# Patient Record
Sex: Male | Born: 1995 | Race: Black or African American | Hispanic: No | Marital: Single | Smoking: Never smoker
Health system: Southern US, Community
[De-identification: ages and names within clinical notes are randomized; demographics above are authoritative.]

## PROBLEM LIST (undated history)

## (undated) DIAGNOSIS — I10 Essential (primary) hypertension: Secondary | ICD-10-CM

## (undated) DIAGNOSIS — F909 Attention-deficit hyperactivity disorder, unspecified type: Secondary | ICD-10-CM

## (undated) HISTORY — PX: HERNIA REPAIR: SHX51

## (undated) HISTORY — PX: ANKLE FRACTURE SURGERY: SHX122

---
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
## 2017-12-20 ENCOUNTER — Encounter (HOSPITAL_COMMUNITY): Payer: Self-pay | Admitting: Emergency Medicine

## 2017-12-20 ENCOUNTER — Emergency Department (HOSPITAL_COMMUNITY)
Admission: EM | Admit: 2017-12-20 | Discharge: 2017-12-20 | Disposition: A | Discharge status: Home / Self Care | Payer: Medicaid Other | Attending: Emergency Medicine | Admitting: Emergency Medicine

## 2017-12-20 ENCOUNTER — Emergency Department (HOSPITAL_COMMUNITY): Payer: Medicaid Other

## 2017-12-20 DIAGNOSIS — R519 Headache, unspecified: Secondary | ICD-10-CM

## 2017-12-20 DIAGNOSIS — R0789 Other chest pain: Secondary | ICD-10-CM | POA: Diagnosis not present

## 2017-12-20 DIAGNOSIS — Z87891 Personal history of nicotine dependence: Secondary | ICD-10-CM | POA: Diagnosis not present

## 2017-12-20 DIAGNOSIS — I10 Essential (primary) hypertension: Secondary | ICD-10-CM | POA: Diagnosis not present

## 2017-12-20 DIAGNOSIS — R0981 Nasal congestion: Secondary | ICD-10-CM | POA: Diagnosis not present

## 2017-12-20 DIAGNOSIS — R51 Headache: Secondary | ICD-10-CM | POA: Insufficient documentation

## 2017-12-20 DIAGNOSIS — R079 Chest pain, unspecified: Secondary | ICD-10-CM

## 2017-12-20 LAB — I-STAT CHEM 8, ED
BUN: 13 mg/dL (ref 6–20)
CALCIUM ION: 1.2 mmol/L (ref 1.15–1.40)
CHLORIDE: 104 mmol/L (ref 98–111)
CREATININE: 1 mg/dL (ref 0.61–1.24)
GLUCOSE: 96 mg/dL (ref 70–99)
HCT: 42 % (ref 39.0–52.0)
Hemoglobin: 14.3 g/dL (ref 13.0–17.0)
Potassium: 3.2 mmol/L — ABNORMAL LOW (ref 3.5–5.1)
Sodium: 141 mmol/L (ref 135–145)
TCO2: 27 mmol/L (ref 22–32)

## 2017-12-20 LAB — I-STAT TROPONIN, ED: TROPONIN I, POC: 0 ng/mL (ref 0.00–0.08)

## 2017-12-20 MED ORDER — PSEUDOEPHEDRINE-GUAIFENESIN ER 120-1200 MG PO TB12: 1.0000 | ORAL_TABLET | Freq: Two times a day (BID) | ORAL | 0 refills | Status: DC

## 2017-12-20 MED ORDER — KETOROLAC TROMETHAMINE 60 MG/2ML IM SOLN
60.0000 mg | Freq: Once | INTRAMUSCULAR | Status: AC
  Administered 2017-12-20: 60 mg via INTRAMUSCULAR
  Filled 2017-12-20: qty 2

## 2017-12-20 NOTE — ED Triage Notes (Signed)
Patient here from home with complaints of headache x2 days.

## 2017-12-20 NOTE — Discharge Instructions (Signed)
Take the prescribed medication as directed.  Can take tylenol or motrin for recurrent headache. Follow-up with your primary care doctor. Return to the ED for new or worsening symptoms.

## 2017-12-20 NOTE — ED Provider Notes (Signed)
[facility] DEPT Provider Note   CSN: [id number] Arrival date & time: 12/20/17  0405     History   Chief Complaint Chief Complaint  Patient presents with  . Headache    HPI Jeff Wall is a 22 y.o. male.  The history is provided by the patient and medical records.  Headache       22 y.o. F with hx of ADHD, asthma, HTN, presenting to the ED for headache and chest pain.  Reports this is been ongoing for about 2 days.  States headache is generalized throughout his entire head, pulsatile in nature.  He denies any associated nausea, vomiting, dizziness, confusion, numbness, or weakness.  No blurred vision or difficulty walking.  Denies any falls or head trauma.  Not currently on anticoagulation.  Denies any history of migraine headaches.  States he took motrin yesterday, but "he does not like taking medicine".  Patient also reports chest pain.  Described as central in nature, worse with deep breathing.  He denies any cough but has been having some nasal congestion.  Denies sick contacts.  No fever or chills.  No hemoptysis.  No known cardiac history.  No history of DVT or PE.  No recent travel or prolonged immobilization.  He is not a smoker.  Past Medical History:  Diagnosis Date  . ADHD (attention deficit hyperactivity disorder)   . Asthma   . Hypertension     There are no active problems to display for this patient.   Past Surgical History:  Procedure Laterality Date  . ANKLE FRACTURE SURGERY    . HERNIA REPAIR          Home Medications    Prior to Admission medications   Medication Sig Start Date End Date Taking? Authorizing Provider  acetaminophen (TYLENOL) 325 MG tablet Take 650 mg by mouth every 6 (six) hours as needed for pain.    [provider]  amLODipine (NORVASC) 5 MG tablet Take 5 mg by mouth daily.    [provider]  amoxicillin (AMOXIL) 500 MG capsule Take 1 capsule (500 mg total) by mouth 2 (two)  times daily. 05/10/14   Bjorn Pippin, PA-C  fluticasone (FLONASE) 50 MCG/ACT nasal spray Place 2 sprays into the nose daily. 03/22/11 03/21/12  Harden Mo, MD  Methylphenidate HCl (CONCERTA PO) Take by mouth.    [provider]  Phenylephrine-DM-GG [facility] COLD CHILDRENS PO) Take by mouth.    [provider]    Family History Family History  Problem Relation Age of Onset  . Asthma Sister     Social History Social History   Tobacco Use  . Smoking status: Never Smoker  . Smokeless tobacco: Never Used  Substance Use Topics  . Alcohol use: No  . Drug use: No     Allergies   Sulfa antibiotics   Review of Systems Review of Systems  Neurological: Positive for headaches.  All other systems reviewed and are negative.    Physical Exam Updated Vital Signs BP (!) 153/104 (BP Location: Left Arm)   Pulse 74   Temp (!) 97.4 F (36.3 C) (Oral)   Resp 18   SpO2 100%   Physical Exam  Constitutional: He is oriented to person, place, and time. He appears well-developed and well-nourished. No distress.  HENT:  Head: Normocephalic and atraumatic.  Right Ear: Tympanic membrane and external ear normal.  Left Ear: Tympanic membrane and external ear normal.  Nose: Mucosal edema present.  Mouth/Throat: Uvula is midline, oropharynx is clear and moist and mucous membranes are normal.  Sounds congested  Eyes: Pupils are equal, round, and reactive to light. Conjunctivae and EOM are normal.  Neck: Normal range of motion and full passive range of motion without pain. Neck supple. No neck rigidity.  No rigidity, no meningismus  Cardiovascular: Normal rate, regular rhythm and normal heart sounds.  No murmur heard. Pulmonary/Chest: Effort normal and breath sounds normal. No respiratory distress. He has no wheezes. He has no rhonchi.  Abdominal: Soft. Bowel sounds are normal. There is no tenderness. There is no guarding.  Musculoskeletal: Normal range of motion. He  exhibits no edema.  Neurological: He is alert and oriented to person, place, and time. He has normal strength. He displays no tremor. No cranial nerve deficit or sensory deficit. He displays no seizure activity.  AAOx3, answering questions and following commands appropriately; equal strength UE and LE bilaterally; CN grossly intact; moves all extremities appropriately without ataxia; no focal neuro deficits or facial asymmetry appreciated  Skin: Skin is warm and dry. No rash noted. He is not diaphoretic.  Psychiatric: He has a normal mood and affect. His behavior is normal. Thought content normal.  Nursing note and vitals reviewed.    ED Treatments / Results  Labs (all labs ordered are listed, but only abnormal results are displayed) Labs Reviewed  I-STAT CHEM 8, ED - Abnormal; Notable for the following components:      Result Value   Potassium 3.2 (*)    All other components within normal limits  I-STAT TROPONIN, ED    EKG None  Radiology Dg Chest 2 View  Result Date: 12/20/2017 CLINICAL DATA:  Acute onset mid chest pain and pressure.  Headache. EXAM: CHEST - 2 VIEW COMPARISON:  Chest radiograph March 22, 2011 FINDINGS: Cardiomediastinal silhouette is normal. No pleural effusions or focal consolidations. Trachea projects midline and there is no pneumothorax. Soft tissue planes and included osseous structures are non-suspicious. IMPRESSION: Normal chest. Electronically Signed   By: Elon Alas M.D.   On: 12/20/2017 04:42    Procedures Procedures (including critical care time)  Medications Ordered in ED Medications  ketorolac (TORADOL) injection 60 mg (60 mg Intramuscular Given 12/20/17 0453)     Initial Impression / Assessment and Plan / ED Course  I have reviewed the triage vital signs and the nursing notes.  Pertinent labs & imaging results that were available during my care of the patient were reviewed by me and considered in my medical decision making (see chart  for details).  22 year old male here with headache and chest pain.  States ongoing for 2 days.  He is afebrile and nontoxic in appearance.  He sounds congested and is sniffling during exam.  He denies any cough, nausea, or vomiting.  Does have some nasal congestion but remainder of exam is benign.  Lungs are clear without any wheezes or rhonchi.  Neurologically intact without focal deficits.  No nuchal rigidity or other signs of meningitis.  Chest x-ray clear.  EKG sinus rhythm without acute ischemic changes.  Troponin negative, chemistry panel reassuring.  Treated here with Toradol and reports resolution of headache.  Remains neurologically intact.  Appears stable for discharge.  Suspect he is likely coming down with a viral illness.  Recommended decongestants, Tylenol/Motrin for headache.  Can follow-up with PCP.  Return here for any new or worsening symptoms.  Final Clinical Impressions(s) / ED Diagnoses   Final diagnoses:  Nonintractable headache, unspecified chronicity pattern,  unspecified headache type  Chest pain in adult  Nasal congestion    ED Discharge Orders         Ordered    Pseudoephedrine-Guaifenesin [facility] D MAX STRENGTH) [phone] MG TB12  2 times daily     12/20/17 Huson, Patricie Geeslin M, PA-C 12/20/17 0541    Orpah Greek, MD 12/20/17 [phone]

## 2019-12-06 ENCOUNTER — Other Ambulatory Visit: Payer: Self-pay | Admitting: Otolaryngology

## 2019-12-13 ENCOUNTER — Encounter (HOSPITAL_BASED_OUTPATIENT_CLINIC_OR_DEPARTMENT_OTHER): Payer: Self-pay | Admitting: Otolaryngology

## 2019-12-13 ENCOUNTER — Other Ambulatory Visit: Payer: Self-pay

## 2019-12-16 ENCOUNTER — Other Ambulatory Visit (HOSPITAL_COMMUNITY): Payer: Medicaid Other | Attending: Otolaryngology

## 2019-12-17 ENCOUNTER — Other Ambulatory Visit (HOSPITAL_COMMUNITY)
Admission: RE | Admit: 2019-12-17 | Discharge: 2019-12-17 | Disposition: A | Discharge status: Home / Self Care | Payer: Medicaid Other | Source: Ambulatory Visit | Attending: Otolaryngology | Admitting: Otolaryngology

## 2019-12-17 DIAGNOSIS — Z01818 Encounter for other preprocedural examination: Secondary | ICD-10-CM | POA: Diagnosis present

## 2019-12-17 DIAGNOSIS — Z20822 Contact with and (suspected) exposure to covid-19: Secondary | ICD-10-CM | POA: Diagnosis not present

## 2019-12-17 LAB — SARS CORONAVIRUS 2 (TAT 6-24 HRS): SARS Coronavirus 2: NEGATIVE

## 2019-12-19 ENCOUNTER — Ambulatory Visit (HOSPITAL_BASED_OUTPATIENT_CLINIC_OR_DEPARTMENT_OTHER): Payer: Medicaid Other | Admitting: Anesthesiology

## 2019-12-19 ENCOUNTER — Ambulatory Visit (HOSPITAL_BASED_OUTPATIENT_CLINIC_OR_DEPARTMENT_OTHER)
Admission: RE | Admit: 2019-12-19 | Discharge: 2019-12-19 | Disposition: A | Discharge status: Home / Self Care | Payer: Medicaid Other | Attending: Otolaryngology | Admitting: Otolaryngology

## 2019-12-19 ENCOUNTER — Encounter (HOSPITAL_BASED_OUTPATIENT_CLINIC_OR_DEPARTMENT_OTHER): Payer: Self-pay | Admitting: Otolaryngology

## 2019-12-19 ENCOUNTER — Encounter (HOSPITAL_BASED_OUTPATIENT_CLINIC_OR_DEPARTMENT_OTHER)
Admission: RE | Disposition: A | Discharge status: Home / Self Care | Payer: Self-pay | Source: Home / Self Care | Attending: Otolaryngology

## 2019-12-19 ENCOUNTER — Other Ambulatory Visit: Payer: Self-pay

## 2019-12-19 DIAGNOSIS — Z79899 Other long term (current) drug therapy: Secondary | ICD-10-CM | POA: Insufficient documentation

## 2019-12-19 DIAGNOSIS — J352 Hypertrophy of adenoids: Secondary | ICD-10-CM | POA: Insufficient documentation

## 2019-12-19 DIAGNOSIS — J45909 Unspecified asthma, uncomplicated: Secondary | ICD-10-CM | POA: Insufficient documentation

## 2019-12-19 DIAGNOSIS — Z882 Allergy status to sulfonamides status: Secondary | ICD-10-CM | POA: Diagnosis not present

## 2019-12-19 DIAGNOSIS — G4730 Sleep apnea, unspecified: Secondary | ICD-10-CM | POA: Insufficient documentation

## 2019-12-19 DIAGNOSIS — F909 Attention-deficit hyperactivity disorder, unspecified type: Secondary | ICD-10-CM | POA: Insufficient documentation

## 2019-12-19 DIAGNOSIS — J0391 Acute recurrent tonsillitis, unspecified: Secondary | ICD-10-CM | POA: Insufficient documentation

## 2019-12-19 DIAGNOSIS — J353 Hypertrophy of tonsils with hypertrophy of adenoids: Secondary | ICD-10-CM | POA: Diagnosis present

## 2019-12-19 DIAGNOSIS — R0683 Snoring: Secondary | ICD-10-CM | POA: Insufficient documentation

## 2019-12-19 HISTORY — PX: TONSILLECTOMY AND ADENOIDECTOMY: SHX28

## 2019-12-19 SURGERY — TONSILLECTOMY AND ADENOIDECTOMY
Anesthesia: General | Site: Throat | Laterality: Bilateral

## 2019-12-19 MED ORDER — LACTATED RINGERS IV SOLN: INTRAVENOUS | Status: DC

## 2019-12-19 MED ORDER — SUGAMMADEX SODIUM 500 MG/5ML IV SOLN
INTRAVENOUS | Status: AC
  Filled 2019-12-19: qty 5

## 2019-12-19 MED ORDER — HYDROCODONE-ACETAMINOPHEN 7.5-325 MG/15ML PO SOLN
15.0000 mL | ORAL | 0 refills | Status: AC | PRN
Start: 2019-12-19 — End: 2019-12-26

## 2019-12-19 MED ORDER — LIDOCAINE HCL (CARDIAC) PF 100 MG/5ML IV SOSY
PREFILLED_SYRINGE | INTRAVENOUS | Status: DC | PRN
  Administered 2019-12-19: 60 mg via INTRAVENOUS

## 2019-12-19 MED ORDER — FENTANYL CITRATE (PF) 100 MCG/2ML IJ SOLN
INTRAMUSCULAR | Status: DC | PRN
  Administered 2019-12-19: 100 ug via INTRAVENOUS
  Administered 2019-12-19: 50 ug via INTRAVENOUS

## 2019-12-19 MED ORDER — BACITRACIN ZINC 500 UNIT/GM EX OINT
1.0000 "application " | TOPICAL_OINTMENT | Freq: Three times a day (TID) | CUTANEOUS | Status: DC
  Filled 2019-12-19: qty 28.35

## 2019-12-19 MED ORDER — FENTANYL CITRATE (PF) 100 MCG/2ML IJ SOLN
INTRAMUSCULAR | Status: AC
  Filled 2019-12-19: qty 2

## 2019-12-19 MED ORDER — PREDNISOLONE SODIUM PHOSPHATE 15 MG/5ML PO SOLN: 15.0000 mg | Freq: Every day | ORAL | 0 refills | Status: AC

## 2019-12-19 MED ORDER — PROPOFOL 10 MG/ML IV BOLUS
INTRAVENOUS | Status: DC | PRN
  Administered 2019-12-19: 300 mg via INTRAVENOUS

## 2019-12-19 MED ORDER — ROCURONIUM BROMIDE 10 MG/ML (PF) SYRINGE
PREFILLED_SYRINGE | INTRAVENOUS | Status: AC
  Filled 2019-12-19: qty 10

## 2019-12-19 MED ORDER — DOCUSATE SODIUM 100 MG PO CAPS: 100.0000 mg | ORAL_CAPSULE | Freq: Two times a day (BID) | ORAL | 0 refills | Status: AC | PRN

## 2019-12-19 MED ORDER — ONDANSETRON HCL 4 MG/2ML IJ SOLN
INTRAMUSCULAR | Status: DC | PRN
  Administered 2019-12-19: 4 mg via INTRAVENOUS

## 2019-12-19 MED ORDER — MIDAZOLAM HCL 2 MG/2ML IJ SOLN
INTRAMUSCULAR | Status: AC
  Filled 2019-12-19: qty 2

## 2019-12-19 MED ORDER — MIDAZOLAM HCL 5 MG/5ML IJ SOLN
INTRAMUSCULAR | Status: DC | PRN
  Administered 2019-12-19: 2 mg via INTRAVENOUS

## 2019-12-19 MED ORDER — FENTANYL CITRATE (PF) 100 MCG/2ML IJ SOLN
25.0000 ug | INTRAMUSCULAR | Status: DC | PRN
  Administered 2019-12-19 (×2): 50 ug via INTRAVENOUS

## 2019-12-19 MED ORDER — ACETAMINOPHEN 500 MG PO TABS
ORAL_TABLET | ORAL | Status: AC
  Filled 2019-12-19: qty 2

## 2019-12-19 MED ORDER — SUGAMMADEX SODIUM 500 MG/5ML IV SOLN
INTRAVENOUS | Status: DC | PRN
  Administered 2019-12-19: 400 mg via INTRAVENOUS

## 2019-12-19 MED ORDER — ONDANSETRON HCL 4 MG/2ML IJ SOLN: 4.0000 mg | INTRAMUSCULAR | Status: DC | PRN

## 2019-12-19 MED ORDER — ACETAMINOPHEN 500 MG PO TABS
1000.0000 mg | ORAL_TABLET | Freq: Once | ORAL | Status: AC
  Administered 2019-12-19: 1000 mg via ORAL

## 2019-12-19 MED ORDER — ONDANSETRON HCL 4 MG PO TABS: 4.0000 mg | ORAL_TABLET | ORAL | Status: DC | PRN

## 2019-12-19 MED ORDER — OXYMETAZOLINE HCL 0.05 % NA SOLN
NASAL | Status: DC | PRN
  Administered 2019-12-19: 1 via TOPICAL

## 2019-12-19 MED ORDER — PROPOFOL 10 MG/ML IV BOLUS
INTRAVENOUS | Status: AC
  Filled 2019-12-19: qty 20

## 2019-12-19 MED ORDER — ROCURONIUM BROMIDE 100 MG/10ML IV SOLN
INTRAVENOUS | Status: DC | PRN
  Administered 2019-12-19: 50 mg via INTRAVENOUS

## 2019-12-19 MED ORDER — LIDOCAINE 2% (20 MG/ML) 5 ML SYRINGE
INTRAMUSCULAR | Status: AC
  Filled 2019-12-19: qty 5

## 2019-12-19 MED ORDER — AMISULPRIDE (ANTIEMETIC) 5 MG/2ML IV SOLN: 10.0000 mg | Freq: Once | INTRAVENOUS | Status: DC | PRN

## 2019-12-19 MED ORDER — DEXAMETHASONE SODIUM PHOSPHATE 4 MG/ML IJ SOLN
INTRAMUSCULAR | Status: DC | PRN
  Administered 2019-12-19: 10 mg via INTRAVENOUS

## 2019-12-19 MED ORDER — DEXAMETHASONE SODIUM PHOSPHATE 10 MG/ML IJ SOLN
INTRAMUSCULAR | Status: AC
  Filled 2019-12-19: qty 1

## 2019-12-19 MED ORDER — DEXMEDETOMIDINE (PRECEDEX) IN NS 20 MCG/5ML (4 MCG/ML) IV SYRINGE
PREFILLED_SYRINGE | INTRAVENOUS | Status: DC | PRN
  Administered 2019-12-19 (×2): 8 ug via INTRAVENOUS

## 2019-12-19 MED ORDER — IBUPROFEN 100 MG/5ML PO SUSP: 600.0000 mg | Freq: Four times a day (QID) | ORAL | Status: DC | PRN

## 2019-12-19 MED ORDER — HYDROCODONE-ACETAMINOPHEN 7.5-325 MG/15ML PO SOLN: 10.0000 mL | ORAL | Status: DC | PRN

## 2019-12-19 MED ORDER — ONDANSETRON HCL 4 MG/2ML IJ SOLN
INTRAMUSCULAR | Status: AC
  Filled 2019-12-19: qty 2

## 2019-12-19 SURGICAL SUPPLY — 36 items
CANISTER SUCT 1200ML W/VALVE (MISCELLANEOUS) ×3 IMPLANT
CATH ROBINSON RED A/P 12FR (CATHETERS) ×3 IMPLANT
CLEANER CAUTERY TIP 5X5 PAD (MISCELLANEOUS) IMPLANT
COAGULATOR SUCT 6 FR SWTCH (ELECTROSURGICAL) ×1
COAGULATOR SUCT SWTCH 10FR 6 (ELECTROSURGICAL) ×2 IMPLANT
COVER BACK TABLE 60X90IN (DRAPES) ×3 IMPLANT
COVER MAYO STAND STRL (DRAPES) ×3 IMPLANT
COVER WAND RF STERILE (DRAPES) IMPLANT
ELECT COATED BLADE 2.86 ST (ELECTRODE) ×3 IMPLANT
ELECT REM PT RETURN 9FT ADLT (ELECTROSURGICAL) ×3
ELECT REM PT RETURN 9FT PED (ELECTROSURGICAL)
ELECTRODE REM PT RETRN 9FT PED (ELECTROSURGICAL) IMPLANT
ELECTRODE REM PT RTRN 9FT ADLT (ELECTROSURGICAL) ×1 IMPLANT
GAUZE SPONGE 4X4 12PLY STRL LF (GAUZE/BANDAGES/DRESSINGS) ×3 IMPLANT
GLOVE BIO SURGEON STRL SZ 6.5 (GLOVE) ×2 IMPLANT
GLOVE BIO SURGEONS STRL SZ 6.5 (GLOVE) ×1
GLOVE BIOGEL PI IND STRL 6.5 (GLOVE) ×2 IMPLANT
GLOVE BIOGEL PI INDICATOR 6.5 (GLOVE) ×4
GLOVE ECLIPSE 6.5 STRL STRAW (GLOVE) ×6 IMPLANT
GOWN STRL REUS W/ TWL LRG LVL3 (GOWN DISPOSABLE) ×4 IMPLANT
GOWN STRL REUS W/TWL LRG LVL3 (GOWN DISPOSABLE) ×12
MARKER SKIN DUAL TIP RULER LAB (MISCELLANEOUS) IMPLANT
NS IRRIG 1000ML POUR BTL (IV SOLUTION) ×3 IMPLANT
PAD CLEANER CAUTERY TIP 5X5 (MISCELLANEOUS)
PENCIL SMOKE EVACUATOR (MISCELLANEOUS) ×3 IMPLANT
SHEET MEDIUM DRAPE 40X70 STRL (DRAPES) ×3 IMPLANT
SOLUTION BUTLER CLEAR DIP (MISCELLANEOUS) ×3 IMPLANT
SPONGE TONSIL TAPE 1 RFD (DISPOSABLE) ×3 IMPLANT
SPONGE TONSIL TAPE 1.25 RFD (DISPOSABLE) IMPLANT
SYR BULB EAR ULCER 3OZ GRN STR (SYRINGE) ×3 IMPLANT
TOWEL GREEN STERILE FF (TOWEL DISPOSABLE) ×3 IMPLANT
TUBE CONNECTING 20'X1/4 (TUBING) ×1
TUBE CONNECTING 20X1/4 (TUBING) ×2 IMPLANT
TUBE SALEM SUMP 12R W/ARV (TUBING) IMPLANT
TUBE SALEM SUMP 16 FR W/ARV (TUBING) ×3 IMPLANT
YANKAUER SUCT BULB TIP NO VENT (SUCTIONS) ×3 IMPLANT

## 2019-12-19 NOTE — Anesthesia Procedure Notes (Signed)
Procedure Name: Intubation Performed by: Boomer Winders M, CRNA Pre-anesthesia Checklist: Patient identified, Emergency Drugs available, Suction available and Patient being monitored Patient Re-evaluated:Patient Re-evaluated prior to induction Oxygen Delivery Method: Circle system utilized Preoxygenation: Pre-oxygenation with 100% oxygen Induction Type: IV induction Ventilation: Mask ventilation without difficulty Laryngoscope Size: Mac and 3 Grade View: Grade I Tube type: Oral Tube size: 7.0 mm Number of attempts: 1 Airway Equipment and Method: Stylet and Oral airway Placement Confirmation: ETT inserted through vocal cords under direct vision,  positive ETCO2,  breath sounds checked- equal and bilateral and CO2 detector Secured at: 22 cm Tube secured with: Tape Dental Injury: Teeth and Oropharynx as per pre-operative assessment        

## 2019-12-19 NOTE — Anesthesia Postprocedure Evaluation (Signed)
Anesthesia Post Note  Patient: Jeff Wall  Procedure(s) Performed: TONSILLECTOMY AND ADENOIDECTOMY (Bilateral Throat)     Patient location during evaluation: PACU Anesthesia Type: General Level of consciousness: awake and alert Pain management: pain level controlled Vital Signs Assessment: post-procedure vital signs reviewed and stable Respiratory status: spontaneous breathing, nonlabored ventilation, respiratory function stable and patient connected to nasal cannula oxygen Cardiovascular status: blood pressure returned to baseline and stable Postop Assessment: no apparent nausea or vomiting Anesthetic complications: no   No complications documented.  Last Vitals:  Vitals:   12/19/19 1000 12/19/19 1015  BP: 139/85 131/86  Pulse: 68 70  Resp: 16 16  Temp:    SpO2: 100% 100%    Last Pain:  Vitals:   12/19/19 1015  TempSrc:   PainSc: 2                  Tiajuana Amass

## 2019-12-19 NOTE — Transfer of Care (Signed)
Immediate Anesthesia Transfer of Care Note  Patient: Jeff Wall  Procedure(s) Performed: TONSILLECTOMY AND ADENOIDECTOMY (Bilateral Throat)  Patient Location: PACU  Anesthesia Type:General  Level of Consciousness: awake, alert  and oriented  Airway & Oxygen Therapy: Patient Spontanous Breathing and Patient connected to face mask oxygen  Post-op Assessment: Report given to RN and Post -op Vital signs reviewed and stable  Post vital signs: Reviewed and stable  Last Vitals:  Vitals Value Taken Time  BP    Temp    Pulse 80 12/19/19 0932  Resp 18 12/19/19 0932  SpO2 100 % 12/19/19 0932  Vitals shown include unvalidated device data.  Last Pain:  Vitals:   12/19/19 0734  TempSrc: Oral  PainSc: 0-No pain         Complications: No complications documented.

## 2019-12-19 NOTE — Anesthesia Preprocedure Evaluation (Signed)
Anesthesia Evaluation  Patient identified by MRN, date of birth, ID band Patient awake    Reviewed: Allergy & Precautions, NPO status , Patient's Chart, lab work & pertinent test results  Airway Mallampati: II  TM Distance: >3 FB Neck ROM: Full    Dental  (+) Dental Advisory Given   Pulmonary asthma ,    breath sounds clear to auscultation       Cardiovascular negative cardio ROS   Rhythm:Regular Rate:Normal     Neuro/Psych negative neurological ROS     GI/Hepatic negative GI ROS, Neg liver ROS,   Endo/Other  negative endocrine ROS  Renal/GU negative Renal ROS     Musculoskeletal   Abdominal   Peds  Hematology negative hematology ROS (+)   Anesthesia Other Findings   Reproductive/Obstetrics                             Anesthesia Physical Anesthesia Plan  ASA: II  Anesthesia Plan: General   Post-op Pain Management:    Induction: Intravenous  PONV Risk Score and Plan: 2 and Dexamethasone, Ondansetron and Treatment may vary due to age or medical condition  Airway Management Planned: Oral ETT  Additional Equipment:   Intra-op Plan:   Post-operative Plan: Extubation in OR  Informed Consent: I have reviewed the patients History and Physical, chart, labs and discussed the procedure including the risks, benefits and alternatives for the proposed anesthesia with the patient or authorized representative who has indicated his/her understanding and acceptance.     Dental advisory given  Plan Discussed with: CRNA  Anesthesia Plan Comments:         Anesthesia Quick Evaluation

## 2019-12-19 NOTE — H&P (Signed)
Jeff Wall is an 24 y.o. male.    Chief Complaint:  Adenotonsillar hypertrophy, recurrent tonsillitis  HPI: Patient presents today for planned elective procedure. He was previously seen in the office on 12/04/2019 with complaints of recurrent tonsillitis, snoring cough, possible sleep apnea. He denies any changes to his history since evaluation at that time.  Past Medical History:  Diagnosis Date  . ADHD (attention deficit hyperactivity disorder)   . Asthma     Past Surgical History:  Procedure Laterality Date  . ANKLE FRACTURE SURGERY    . HERNIA REPAIR      Family History  Problem Relation Age of Onset  . Asthma Sister     Social History:  reports that he has never smoked. He has never used smokeless tobacco. He reports that he does not drink alcohol and does not use drugs.  Allergies:  Allergies  Allergen Reactions  . Sulfa Antibiotics Swelling    No medications prior to admission.    Results for orders placed or performed during the hospital encounter of 12/17/19 (from the past 48 hour(s))  SARS CORONAVIRUS 2 (TAT 6-24 HRS) Nasopharyngeal Nasopharyngeal Swab     Status: None   Collection Time: 12/17/19  9:04 AM   Specimen: Nasopharyngeal Swab  Result Value Ref Range   SARS Coronavirus 2 NEGATIVE NEGATIVE    Comment: (NOTE) SARS-CoV-2 target nucleic acids are NOT DETECTED.  The SARS-CoV-2 RNA is generally detectable in upper and lower respiratory specimens during the acute phase of infection. Negative results do not preclude SARS-CoV-2 infection, do not rule out co-infections with other pathogens, and should not be used as the sole basis for treatment or other patient management decisions. Negative results must be combined with clinical observations, patient history, and epidemiological information. The expected result is Negative.  Fact Sheet for Patients: SugarRoll.be  Fact Sheet for Healthcare  Providers: https://www.woods-mathews.com/  This test is not yet approved or cleared by the [place] FDA and  has been authorized for detection and/or diagnosis of SARS-CoV-2 by FDA under an Emergency Use Authorization (EUA). This EUA will remain  in effect (meaning this test can be used) for the duration of the COVID-19 declaration under Se ction 564(b)(1) of the Act, 21 U.S.C. section 360bbb-3(b)(1), unless the authorization is terminated or revoked sooner.  Performed at [facility], [place] [address] [place], [place] [address]    No results found.  ROS: ROS  Blood pressure 137/82, pulse 65, temperature 97.8 F (36.6 C), temperature source Oral, resp. rate 18, height 5\' 11"  (1.803 m), weight 126.1 kg, SpO2 100 %.  PHYSICAL EXAM: General: Well developed, well nourished. No acute distress. Voice intact, no voice breaks  Head/Face: Normocephalic, atraumatic.  Eyes: Pupils are equal, round and reactive to light. Conjunctiva and lids are normal. Normal extraocular mobility.  Ears: Pinna and external meatus normal bilaterally Nose: No gross deformity or lesions. No purulent discharge. Mouth/Oropharynx: Lips normal, teeth and gums normal with good dentition, normal oral vestibule. Normal floor of mouth, tongue and oral mucosa, no mucosal lesions, ulcer or mass, normal tongue mobility. Uvula midline. Hard and soft palate normal with normal mobility. 4+ tonsils, cryptic, no erythema or exudate.  Neck: Trachea midline. No masses. No crepitus.  Lymphatic: No lymphadenopathy in the neck.  Respiratory: No stridor or distress.  Extremities: No edema or cyanosis. Warm and well-perfused.  Neurologic: Alert and oriented to self, place and time. Normal reflexes and motor skills, balance and coordination. Moving all extremities without gross  abnormality.  Psychiatric: No unusual anxiety or evidence of depression. Appropriate affect.   Studies Reviewed:  None   Assessment/Plan  Jeff Wall is a 24 y.o. male with adenotonsillar hypertrophy, recurrent tonsillitis, snoring, symptoms of sleep disordered breathing. Patient consented for tonsillectomy and adenoidectomy. Benefits and possible complications of the procedure were again reviewed with the patient as well as the postoperative course and anticipated recovery. Consent signed and placed in chart, all questions answered.   Burgundy Matuszak A Kinsey Cowsert 12/19/2019, 8:21 AM

## 2019-12-19 NOTE — Op Note (Signed)
OPERATIVE NOTE  YUNIOR JAIN Date/Time of Admission: 12/19/2019  7:13 AM  CSN: [id number];LKT:[id number] Attending Provider: Ebbie Latus A, DO Room/Bed: [facility]SP/NONE DOB: March 06, 1995 Age: 24 y.o.   Pre-Op Diagnosis: TONSILLAR HYPERTROPHY, RECURRENT TONSILLITIS  Post-Op Diagnosis: TONSILLAR HYPERTROPHY, RECURRENT TONSILLITIS  Procedure: Procedure(s): TONSILLECTOMY AND ADENOIDECTOMY  Anesthesia: General  Surgeon(s): Jason Coop, DO  Staff: Circulator: Ted Mcalpine, RN Relief Scrub: Carson Myrtle, RN Scrub Person: Courtney Heys S  Implants: * No implants in log *  Specimens: ID Type Source Tests Collected by Time Destination  1 : Right tonsil Tissue PATH Tonsil/Adenoid SURGICAL PATHOLOGY Shalese Strahan, [place] A, DO 12/19/2019 0854   2 : Left tonsil Tissue PATH Tonsil/Adenoid SURGICAL PATHOLOGY Jasmine Mcbeth, [place] A, DO 34/28/7681 1572     Complications: None  EBL: 5 ML  Condition: stable  Operative Findings:  4+ tonsils, adenoids with 40% obstruction  Description of Operation: Once operative consent was obtained, and the surgical site confirmed with the operating room team, the patient was brought back to the operating room and general endotracheal anesthesia was obtained. The patient was turned over to the ENT service. A Crow-Davis mouth gag was used to expose the oral cavity and oropharynx. A red rubber catheter was placed from the right nasal cavity to the oral cavity to retract the soft palate. Attention was first turned to the right tonsil, which was excised at the level of the capsule using electrocautery. Hemostasis was obtained. The exact procedure was repeated on the left side. Attention was turned to the adenoid bed using a mirror from the oral cavity and the adenoids were removed using electrocautery. The patient was relieved from oral suspension and then placed back in oral suspension to assure hemostasis, which was obtained. An oral gastric tube  was placed into the stomach and suctioned to reduce postoperative nausea. The patient was turned back over to the anesthesia service. The patient was then transferred to the PACU in stable condition.  Jason Coop, [facility]  12/19/2019

## 2019-12-19 NOTE — Discharge Instructions (Signed)
Post Anesthesia Home Care Instructions  Activity: Get plenty of rest for the remainder of the day. A responsible individual must stay with you for 24 hours following the procedure.  For the next 24 hours, DO NOT: -Drive a car -Paediatric nurse -Drink alcoholic beverages -Take any medication unless instructed by your physician -Make any legal decisions or sign important papers.  Meals: Start with liquid foods such as gelatin or soup. Progress to regular foods as tolerated. Avoid greasy, spicy, heavy foods. If nausea and/or vomiting occur, drink only clear liquids until the nausea and/or vomiting subsides. Call your physician if vomiting continues.  Special Instructions/Symptoms: Your throat may feel dry or sore from the anesthesia or the breathing tube placed in your throat during surgery. If this causes discomfort, gargle with warm salt water. The discomfort should disappear within 24 hours.  If you had a scopolamine patch placed behind your ear for the management of post- operative nausea and/or vomiting:  1. The medication in the patch is effective for 72 hours, after which it should be removed.  Wrap patch in a tissue and discard in the trash. Wash hands thoroughly with soap and water. 2. You may remove the patch earlier than 72 hours if you experience unpleasant side effects which may include dry mouth, dizziness or visual disturbances. 3. Avoid touching the patch. Wash your hands with soap and water after contact with the patch.       Tonsillectomy Post Operative Instructions   Effects of Anesthesia Tonsillectomy (with or without Adenoidectomy) involves a brief anesthesia,  typically 20 - 60 minutes. Patients may be quite irritable for several hours after  surgery. If sedatives were given, some patients will remain sleepy for much of the  day. Nausea and vomiting is occasionally seen, and usually resolves by the  evening of surgery - even without additional  medications. Medications Tonsillectomy is a painful procedure. Pain medications help but do not  completely alleviate the discomfort.   ADULTS . Adults will be prescribed a narcotic pain pill or elixir (Percocet, Norco,  Vicodin, Lortab are some examples). Do not use aspirin products (Bayer's,  Goode powders, Excedrin) - they may increase the chance of bleeding.  Every time you take a dose of pain medication, do so with some food or full  liquid to prevent nausea. The best thing to take with the medication is a  cup of pudding or ice cream, a milkshake or cup of milk. You may take additional Tylenol (650mg ) every 6 hours if you are having breakthrough pain. If pain is still not controlled, take 800mg  of Ibuprofen every 8 hours, but make sure to take with food.   Activity  Vigorous exercise should be avoided for 14 days after surgery. This risk of  bleeding is increased with increased activity and bleeding from where the tonsils  were removed can happen for up to 2 weeks after surgery. Baths and showers are fine. Many patients have reduced energy levels until their pain decreases and  they are taking in more nourishment and calories. You should not travel out of  the local area for a full 2 weeks after surgery in case you experience bleeding  after surgery.   Eating & Drinking Dehydration is the biggest enemy in the recovery period. It will increase the pain,  increase the risk of bleeding and delay the healing. It usually happens because  the pain of swallowing keeps the patient from drinking enough liquids. Therefore,  the key is to force  fluids, and that works best when pain control is maximized. You cannot drink too much after having a tonsillectomy. The only drinks to avoid  are citrus like orange and grapefruit juices because they will burn the back of the  throat. Incentive charts with prizes work very well to get young children to drink  fluids and take their medications after  surgery. Some patients will have a small  amount of liquid come out of their nose when they drink after surgery, this should  stop within a few weeks after surgery.  Although drinking is more important, eating is fine even the day of surgery but  avoid foods that are crunchy or have sharp edges. Dairy products may be taken,  if desired. You should avoid acidic, salty and spicy foods (especially tomato  sauces). Chewing gum or bubble gum encourages swallowing and saliva flow,  and may even speed up the healing. Almost everyone loses some weight after  tonsillectomy (which is usually regained in the 2nd or 3rd week after surgery).  Drinking is far more important that eating in the first 14 days after surgery, so  concentrate on that first and foremost. Adequate liquid intake probably speeds  Recovery.  Other things. . Pain is usually the worst in the morning; this can be avoided by overnight  medication administration if needed. . Since moisture helps soothe the healing throat, a room humidifier (hot or  cold) is suggested when the patient is sleeping. . Some patients feel pain relief with an ice collar to the neck (or a bag of  frozen peas or corn). Be careful to avoid placing cold plastic directly on the  skin - wrap in a paper towel or washcloth.  . If the tonsils and adenoids are very large, the patient's voice may change  after surgery. . The recovery from tonsillectomy is a very painful period, often the worst  pain people can recall, so please be understanding and patient with  yourself, or the patient you are caring for. It is helpful to take pain  medicine during the night if the patient awakens-- the worst pain is usually  in the morning. The pain may seem to increase 2-5 days after surgery - this is normal when inflammation sets in. Please be aware that no  combination of medicines will eliminate the pain - the patient will need to  continue eating/drinking in spite of the  remaining discomfort. . You should not travel outside of the local area for 14 days after surgery in  case significant bleeding occurs.   What should we expect after surgery? As previously mentioned, most patients have a significant amount of pain after  tonsillectomy, with pain resolving 7-14 days after surgery. Older children and  adults seem to have more discomfort. Most patients can go home the day of  surgery. . Ear pain: Many people will complain of earaches after tonsillectomy. This  is caused by referred pain coming from throat and not the ears. Give pain  medications and encourage liquid intake. . Fever: Many patients have a low-grade fever after tonsillectomy - up to  101.5 degrees (380 C.) for several days. Higher prolonged fever should be  reported to your surgeon. . Bad looking (and bad smelling) throat: After surgery, the place where  the tonsils were removed is covered with a white film, which is a moist  scab. This usually develops 3-5 days after surgery and falls off 10-14 days  after surgery and usually causes bad breath.  There will be some redness  and swelling as well. The uvula (the part of the throat that hangs down in  the middle between the tonsils) is usually swollen for several days after  surgery. . Sore/bruised feeling of Tongue: This is common for the first few days  after surgery because the tongue is pushed out of the way to take out the  tonsils in surgery.  When should we call the doctor? . Nausea/Vomiting: This is a common side effect from General Anesthesia  and can last up to 24-36 hours after surgery. Try giving sips of clear liquids  like Sprite, water or apple juice then gradually increase fluid intake. If the  nausea or vomiting continues beyond this time frame, call the doctor's  office for medications that will help relieve the nausea and vomiting. . Bleeding: Significant bleeding is rare, but it happens to about 5% of  patients who have  tonsillectomy. It may come from the nose, the mouth, or  be vomited or coughed up. Ice water mouthwashes may help stop or  reduce bleeding. If you have bleeding that does not stop, you should call  the office (during business hours) or the on call physician (evenings, weekends) or go to the emergency room if you are very concerned.  . Dehydration: If there has been little or no liquids intake for 24 hours, the  patient may need to come to the hospital for IV fluids. Signs of dehydration  include lethargy, the lack of tears when crying, and reduced or very  concentrated urine output. . High Fever: If the patient has a consistent temperatures greater than 102,  or when accompanied by cough or difficulty breathing, you should call the  doctor's office.

## 2019-12-20 ENCOUNTER — Encounter (HOSPITAL_BASED_OUTPATIENT_CLINIC_OR_DEPARTMENT_OTHER): Payer: Self-pay | Admitting: Otolaryngology

## 2019-12-20 LAB — SURGICAL PATHOLOGY

## 2019-12-21 NOTE — Discharge Summary (Signed)
Physician Discharge Summary  Patient ID: Jeff Wall MRN: [id number] DOB/AGE: [birth year] 24 y.o.  Admit date: 12/19/2019 Discharge date: 12/21/2019  Admission Diagnoses:  Active Problems:   Adenotonsillar hypertrophy   Discharge Diagnoses:  Same  Surgeries: Procedure(s): TONSILLECTOMY AND ADENOIDECTOMY on 12/19/2019   Consultants: None  Discharged Condition: Improved  Hospital Course: Jeff Wall is an 24 y.o. male who was admitted 12/19/2019 with a chief complaint of snoring, sleep disordered breathing, recurrent tonsillitis, and found to have a diagnosis of adenotonsillar hypertrophy.  They were brought to the operating room on 12/19/2019 and underwent the above named procedures.  Patient was monitored in the recovery area for 4 hours post procedure and was noted to do well.  Pain was adequately controlled, he was tolerating a liquid diet without difficulty, and his vitals were stable.  He was deemed stable for discharge home.   Recent vital signs:  Vitals:   12/19/19 1015 12/19/19 1400  BP: 131/86   Pulse: 70   Resp: 16   Temp:  98.7 F (37.1 C)  SpO2: 100%     Recent laboratory studies:  Results for orders placed or performed during the hospital encounter of 12/19/19  Surgical pathology  Result Value Ref Range   SURGICAL PATHOLOGY      SURGICAL PATHOLOGY CASE: [id number] PATIENT: [place] Surgical Pathology Report     Clinical History: tonsillar hypertrophy, recurrent tonsillitis (cm)   FINAL MICROSCOPIC DIAGNOSIS:  A. TONSIL, RIGHT, TONSILLECTOMY: -  Benign tonsil with lymphoid hyperplasia  B. TONSIL, LEFT, TONSILLECTOMY: -  Benign tonsil with lymphoid hyperplasia   GROSS DESCRIPTION:  Specimen A: Right tonsil, received in formalin Size: 3.5 x 2.6 x 1.9 cm Mucosa: Pink to hyperemic, varies from smooth to granular. Cut Surface(s): Tan-pink to dark red, normal crypt architecture.  No discrete lesions or masses. Block Summary: A  representative section is submitted in one block.  Specimen B: Left tonsil, received in formalin Size: 3.9 x 2.1 x 2 cm Mucosa: Pink to hyperemic, varies from smooth to granular. Cut Surface(s): Tan-pink to dark red, normal crypt architecture.  No discrete lesions or masses. Block Summary: A representative section is submitted in one block.  SW 11/11/ 2021    Final Diagnosis performed by Thressa Sheller, MD.   Electronically signed 12/20/2019 Technical and / or Professional components performed at [facility]. [facility], [place] [address], [place], [place] [address].  Immunohistochemistry Technical component (if applicable) was performed at [facility]. [address], [place], [place], [place] [address].   IMMUNOHISTOCHEMISTRY DISCLAIMER (if applicable): Some of these immunohistochemical stains may have been developed and the performance characteristics determine by [facility]. Some may not have been cleared or approved by the U.S. Food and Drug Administration. The FDA has determined that such clearance or approval is not necessary. This test is used for clinical purposes. It should not be regarded as investigational or for research. This laboratory is certified under the [employer] (CLIA-88) as qualified to perform high complexity clinical l aboratory testing.  The controls stained appropriately.     Discharge Medications:   Allergies as of 12/19/2019      Reactions   Sulfa Antibiotics Swelling      Medication List    TAKE these medications   docusate sodium 100 MG capsule Commonly known as: Colace Take 1 capsule (100 mg total) by mouth 2 (two) times daily as needed for up to 10 days.   HYDROcodone-acetaminophen  7.5-325 mg/15 ml solution Commonly known as: HYCET Take 15 mLs by mouth every 4 (four) hours as needed for up to 7 days for moderate pain or severe pain.   prednisoLONE 15 MG/5ML  solution Commonly known as: ORAPRED Take 5 mLs (15 mg total) by mouth daily for 5 days.       Diagnostic Studies: No results found.  Disposition: Discharge disposition: 01-Home or Self Care       Discharge Instructions    Diet general   Complete by: As directed    No wound care   Complete by: As directed        Follow-up Information    Matha Masse A, DO Follow up.   Specialty: Otolaryngology Why: Follow up as needed. Nurse will call to check on you in 2-3 weeks  Contact information: [place]. [address] [place] [address] [phone]                Signed: [place] Lillieanna Tuohy 12/21/2019, 4:29 PM

## 2019-12-23 ENCOUNTER — Other Ambulatory Visit: Payer: Self-pay

## 2019-12-23 ENCOUNTER — Encounter (HOSPITAL_COMMUNITY): Payer: Self-pay | Admitting: Emergency Medicine

## 2019-12-23 ENCOUNTER — Emergency Department (HOSPITAL_COMMUNITY)
Admission: EM | Admit: 2019-12-23 | Discharge: 2019-12-23 | Disposition: A | Discharge status: Home / Self Care | Payer: Medicaid Other | Attending: Emergency Medicine | Admitting: Emergency Medicine

## 2019-12-23 DIAGNOSIS — J45909 Unspecified asthma, uncomplicated: Secondary | ICD-10-CM | POA: Diagnosis not present

## 2019-12-23 DIAGNOSIS — J029 Acute pharyngitis, unspecified: Secondary | ICD-10-CM | POA: Diagnosis not present

## 2019-12-23 DIAGNOSIS — Z9089 Acquired absence of other organs: Secondary | ICD-10-CM

## 2019-12-23 DIAGNOSIS — G8918 Other acute postprocedural pain: Secondary | ICD-10-CM | POA: Insufficient documentation

## 2019-12-23 MED ORDER — AMOXICILLIN 500 MG PO CAPS: 1000.0000 mg | ORAL_CAPSULE | Freq: Once | ORAL | Status: DC

## 2019-12-23 MED ORDER — AMOXICILLIN 400 MG/5ML PO SUSR: 1000.0000 mg | Freq: Two times a day (BID) | ORAL | 0 refills | Status: AC

## 2019-12-23 MED ORDER — AMOXICILLIN 500 MG PO CAPS: 1000.0000 mg | ORAL_CAPSULE | Freq: Two times a day (BID) | ORAL | 0 refills | Status: DC

## 2019-12-23 NOTE — ED Triage Notes (Signed)
Pt had his tonsils out 4 days ago and his throat is sore, but he states that the pain now is different than before. He is concerned about infection.

## 2019-12-23 NOTE — ED Provider Notes (Signed)
[facility] DEPT Provider Note: Georgena Spurling, MD, FACEP  CSN: [id number] MRN: [id number] ARRIVAL: 12/23/19 at [place]: [place]  Sore Throat   HISTORY OF PRESENT ILLNESS  12/23/19 5:28 AM Jeff Wall is a 24 y.o. male who underwent a tonsillectomy and adenoidectomy for adenotonsillar hypertrophy on 12/19/2019.  He is here this morning with pain which is different than the pain he was initially having postoperatively.  He is concerned about infection.  He was discharged home on hydrocodone/APAP liquid, docusate and prednisone for 5 days.  He has not had a fever and is not having anterior cervical lymphadenopathy.  His pain is moderate, worse with swallowing.   Past Medical History:  Diagnosis Date  . ADHD (attention deficit hyperactivity disorder)   . Asthma     Past Surgical History:  Procedure Laterality Date  . ANKLE FRACTURE SURGERY    . HERNIA REPAIR    . TONSILLECTOMY AND ADENOIDECTOMY Bilateral 12/19/2019   Procedure: TONSILLECTOMY AND ADENOIDECTOMY;  Surgeon: Jason Coop, DO;  Location: [facility];  Service: ENT;  Laterality: Bilateral;    Family History  Problem Relation Age of Onset  . Asthma Sister     Social History   Tobacco Use  . Smoking status: Never Smoker  . Smokeless tobacco: Never Used  Vaping Use  . Vaping Use: Some days  Substance Use Topics  . Alcohol use: No  . Drug use: No    Prior to Admission medications   Medication Sig Start Date End Date Taking? Authorizing Provider  amoxicillin (AMOXIL) 500 MG capsule Take 2 capsules (1,000 mg total) by mouth 2 (two) times daily. 12/23/19   Lyah Millirons, MD  docusate sodium (COLACE) 100 MG capsule Take 1 capsule (100 mg total) by mouth 2 (two) times daily as needed for up to 10 days. 12/19/19 12/29/19  Skotnicki, Meghan A, DO  HYDROcodone-acetaminophen (HYCET) 7.5-325 mg/15 ml solution Take 15 mLs by mouth every 4 (four) hours as needed for up to 7  days for moderate pain or severe pain. 12/19/19 12/26/19  Skotnicki, Meghan A, DO  prednisoLONE (ORAPRED) 15 MG/5ML solution Take 5 mLs (15 mg total) by mouth daily for 5 days. 12/20/19 12/25/19  Skotnicki, Meghan A, DO    Allergies Sulfa antibiotics   REVIEW OF SYSTEMS  Negative except as noted here or in the History of Present Illness.   PHYSICAL EXAMINATION  Initial Vital Signs Blood pressure (!) 154/100, pulse 71, temperature 98.2 F (36.8 C), temperature source Oral, resp. rate 15, height 5\' 11"  (1.803 m), weight 126.1 kg, SpO2 98 %.  Examination General: Well-developed, well-nourished male in no acute distress; appearance consistent with age of record HENT: normocephalic; pharynx status post tonsillectomy with typical postoperative appearance:    Eyes: pupils equal, round and reactive to light; extraocular muscles intact Neck: supple; no lymphadenopathy Heart: regular rate and rhythm Lungs: clear to auscultation bilaterally Abdomen: soft; nondistended; nontender; bowel sounds present Extremities: No deformity; full range of motion Neurologic: Awake, alert and oriented; motor function intact in all extremities and symmetric; no facial droop Skin: Warm and dry Psychiatric: Normal mood and affect   RESULTS  Summary of this visit's results, reviewed and interpreted by myself:   EKG Interpretation  Date/Time:    Ventricular Rate:    PR Interval:    QRS Duration:   QT Interval:    QTC Calculation:   R Axis:     Text Interpretation:  Laboratory Studies: No results found for this or any previous visit (from the past 24 hour(s)). Imaging Studies: No results found.  ED COURSE and MDM  Nursing notes, initial and subsequent vitals signs, including pulse oximetry, reviewed and interpreted by myself.  Vitals:   12/23/19 0523  BP: (!) 154/100  Pulse: 71  Resp: 15  Temp: 98.2 F (36.8 C)  TempSrc: Oral  SpO2: 98%  Weight: 126.1 kg  Height: 5\' 11"  (1.803  m)   Medications  amoxicillin (AMOXIL) capsule 1,000 mg (has no administration in time range)    The patient does not appear to have a postoperative infection but would like to be placed on an antibiotic as a precaution.  We will place him on amoxicillin and refer him back to his surgeon.   PROCEDURES  Procedures   ED DIAGNOSES     ICD-10-CM   1. Post-tonsillectomy pain  G89.18    Z90.89        Jaquavis Felmlee, MD 12/23/19 [phone]

## 2020-04-16 IMAGING — CR DG CHEST 2V
2 series · 2 of 2 positions shown · non-contrast
Comparison: Chest radiograph March 22, 2011

CLINICAL DATA: Acute onset mid chest pain and pressure.  Headache.

EXAM:
CHEST - 2 VIEW

[w chest pa]
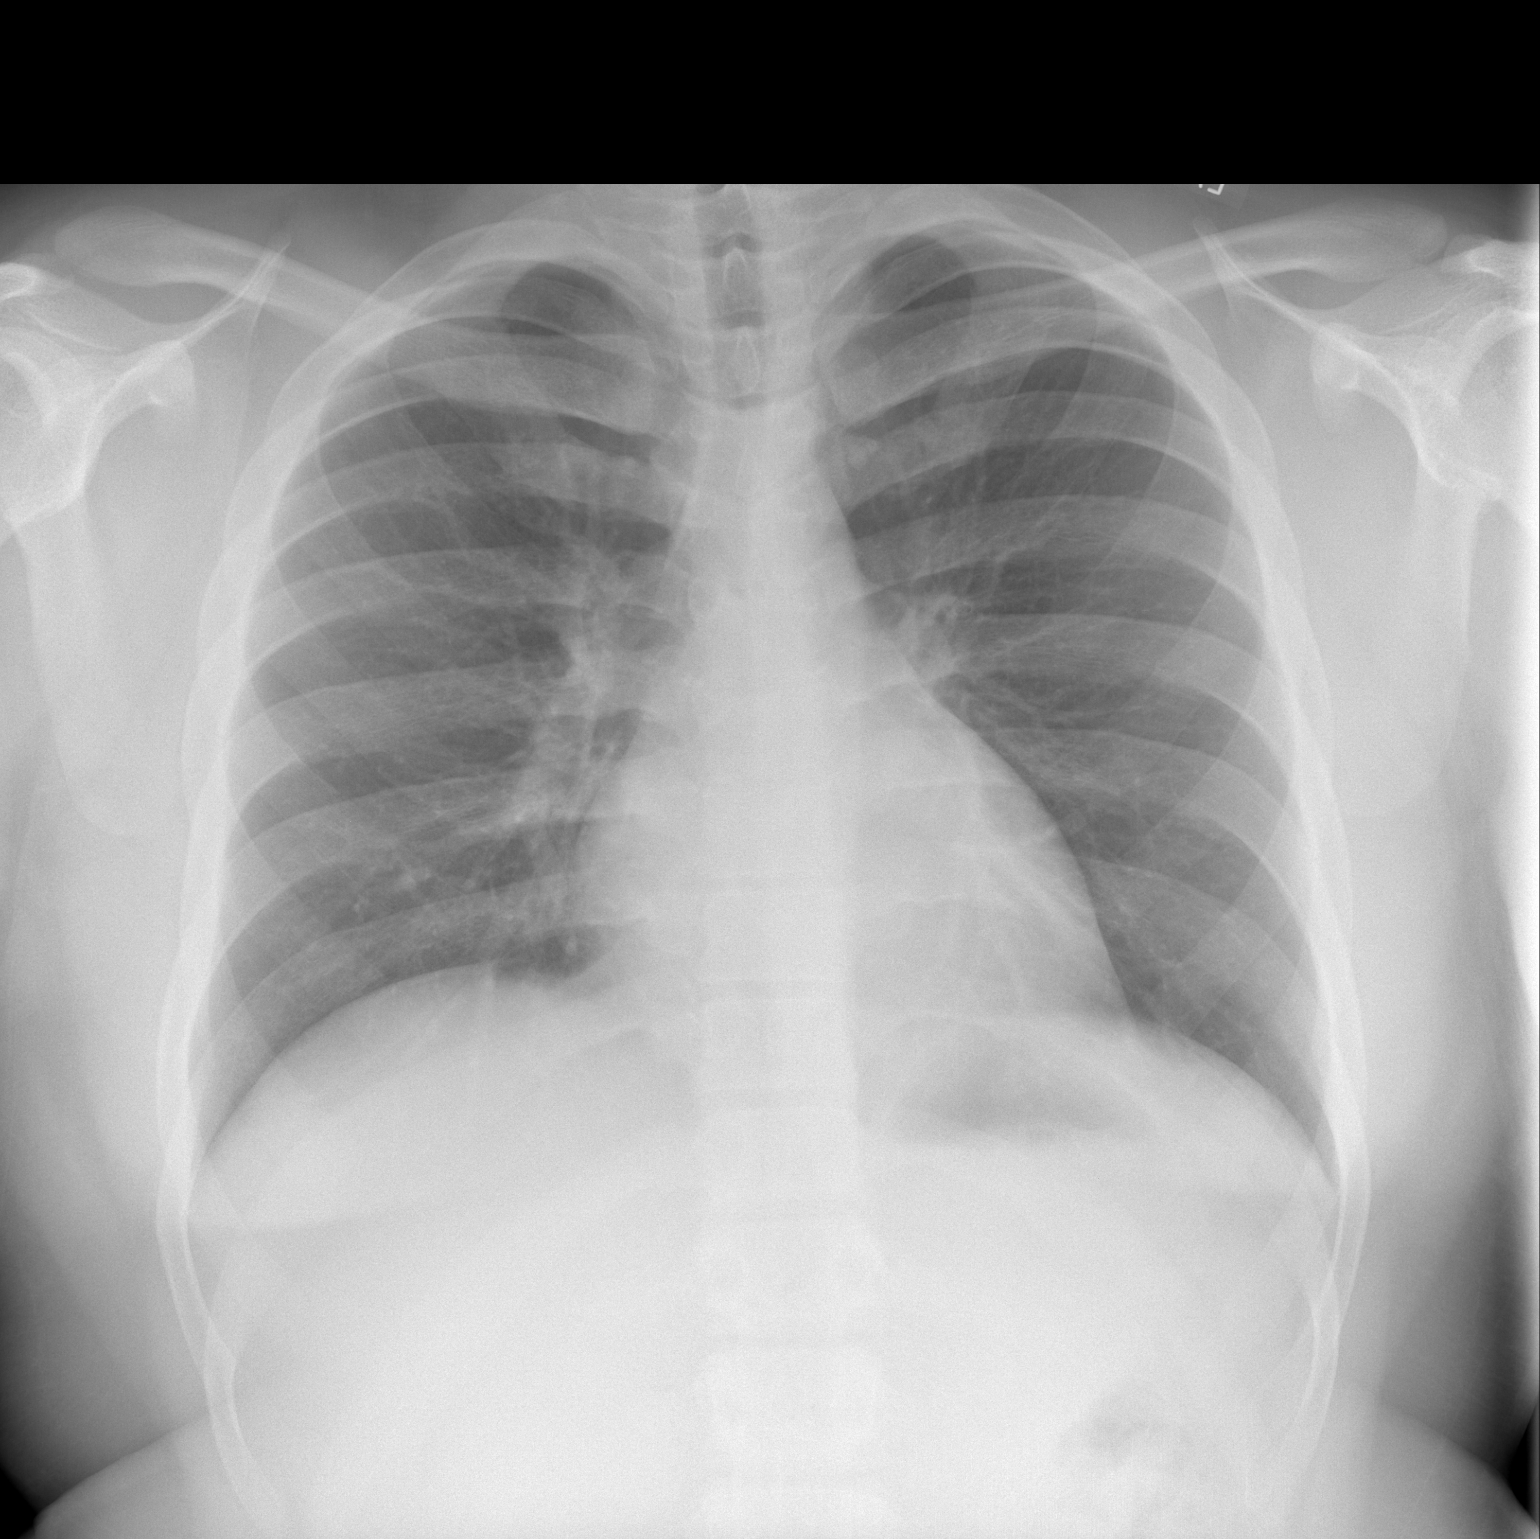

[w chest lat]
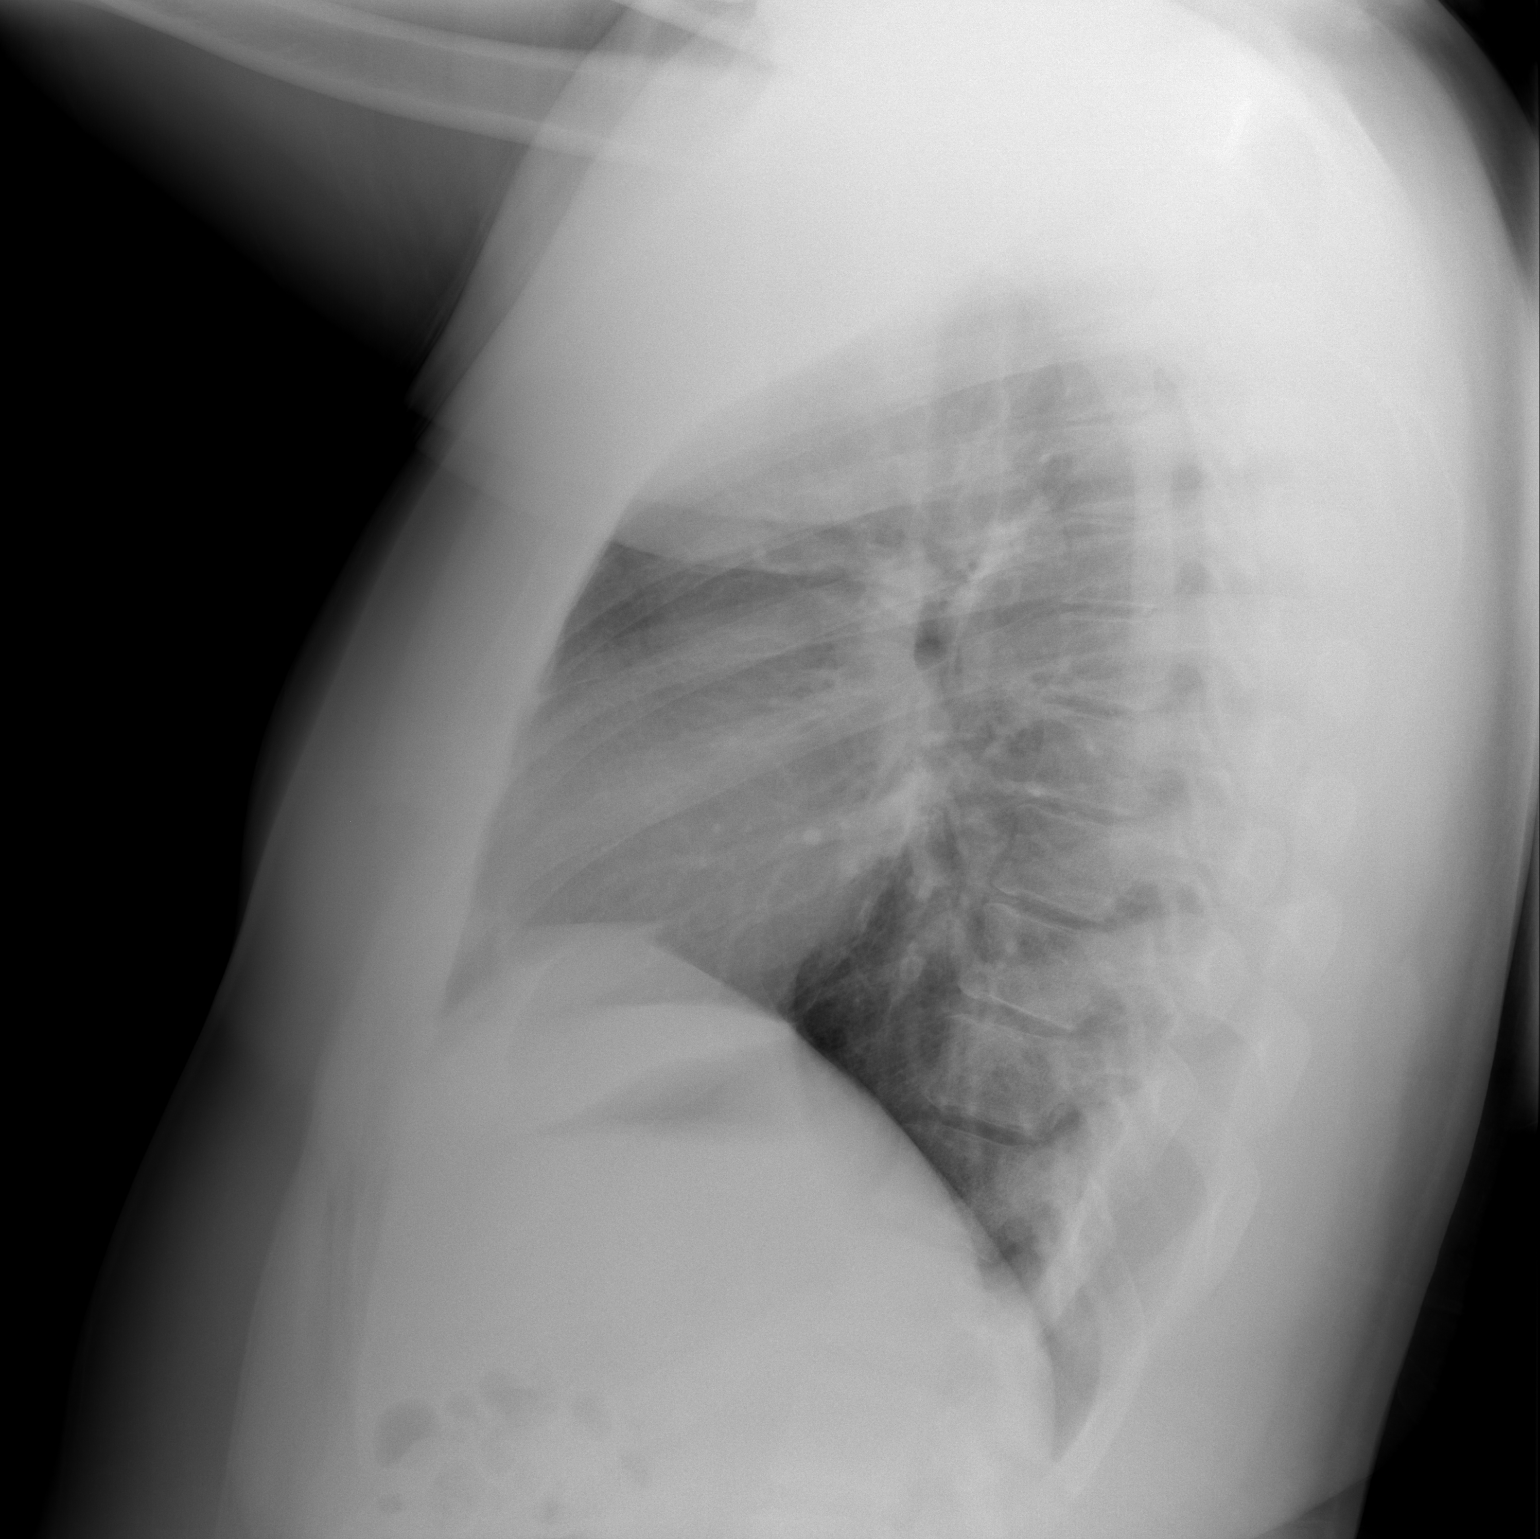

[2 of 2 positions shown; findings below may reference images not displayed]

FINDINGS: Cardiomediastinal silhouette is normal. No pleural effusions or
focal consolidations. Trachea projects midline and there is no
pneumothorax. Soft tissue planes and included osseous structures are
non-suspicious.
IMPRESSION: Normal chest.

## 2020-06-21 ENCOUNTER — Other Ambulatory Visit: Payer: Self-pay

## 2020-06-21 ENCOUNTER — Emergency Department (HOSPITAL_COMMUNITY)
Admission: EM | Admit: 2020-06-21 | Discharge: 2020-06-21 | Disposition: A | Discharge status: Home / Self Care | Payer: Medicaid Other | Attending: Emergency Medicine | Admitting: Emergency Medicine

## 2020-06-21 DIAGNOSIS — S61412A Laceration without foreign body of left hand, initial encounter: Secondary | ICD-10-CM

## 2020-06-21 DIAGNOSIS — W25XXXA Contact with sharp glass, initial encounter: Secondary | ICD-10-CM | POA: Diagnosis not present

## 2020-06-21 DIAGNOSIS — Z23 Encounter for immunization: Secondary | ICD-10-CM | POA: Diagnosis not present

## 2020-06-21 DIAGNOSIS — S61422A Laceration with foreign body of left hand, initial encounter: Secondary | ICD-10-CM | POA: Diagnosis not present

## 2020-06-21 DIAGNOSIS — S6992XA Unspecified injury of left wrist, hand and finger(s), initial encounter: Secondary | ICD-10-CM | POA: Diagnosis present

## 2020-06-21 DIAGNOSIS — J45909 Unspecified asthma, uncomplicated: Secondary | ICD-10-CM | POA: Diagnosis not present

## 2020-06-21 MED ORDER — LIDOCAINE-EPINEPHRINE (PF) 2 %-1:200000 IJ SOLN
10.0000 mL | Freq: Once | INTRAMUSCULAR | Status: AC
  Administered 2020-06-21: 10 mL
  Filled 2020-06-21: qty 20

## 2020-06-21 MED ORDER — TETANUS-DIPHTH-ACELL PERTUSSIS 5-2.5-18.5 LF-MCG/0.5 IM SUSY
0.5000 mL | PREFILLED_SYRINGE | Freq: Once | INTRAMUSCULAR | Status: AC
  Administered 2020-06-21: 0.5 mL via INTRAMUSCULAR
  Filled 2020-06-21: qty 0.5

## 2020-06-21 NOTE — ED Triage Notes (Signed)
Pt was moving a glass table and it broke and cut his L hand. Bleeding controlled. Unsure of last tetanus.

## 2020-06-21 NOTE — Discharge Instructions (Addendum)

## 2020-06-21 NOTE — ED Provider Notes (Signed)
[facility] DEPT Provider Note   CSN: [id number] Arrival date & time: 06/21/20  0453     History Chief Complaint  Patient presents with  . Laceration    Jeff Wall is a 25 y.o. male presents to the Emergency Department complaining of acute, persistent laceration to the dorsum of the left hand onset several hours prior to arrival.  Patient reports he was moving a glass coffee table when it broke cutting his hand.  He denies numbness, tingling or weakness.  Hemostasis achieved prior to arrival.  Unknown last tetanus.  No aggravating or alleviating factors.  The history is provided by the patient and medical records. No language interpreter was used.       Past Medical History:  Diagnosis Date  . ADHD (attention deficit hyperactivity disorder)   . Asthma     Patient Active Problem List   Diagnosis Date Noted  . Adenotonsillar hypertrophy 12/19/2019    Past Surgical History:  Procedure Laterality Date  . ANKLE FRACTURE SURGERY    . HERNIA REPAIR    . TONSILLECTOMY AND ADENOIDECTOMY Bilateral 12/19/2019   Procedure: TONSILLECTOMY AND ADENOIDECTOMY;  Surgeon: Jason Coop, DO;  Location: [facility];  Service: ENT;  Laterality: Bilateral;       Family History  Problem Relation Age of Onset  . Asthma Sister     Social History   Tobacco Use  . Smoking status: Never Smoker  . Smokeless tobacco: Never Used  Vaping Use  . Vaping Use: Some days  Substance Use Topics  . Alcohol use: No  . Drug use: No    Home Medications Prior to Admission medications   Not on File    Allergies    Sulfa antibiotics  Review of Systems   Review of Systems  Constitutional: Negative for fever.  Gastrointestinal: Negative for nausea and vomiting.  Skin: Positive for wound.  Allergic/Immunologic: Negative for immunocompromised state.  Neurological: Negative for weakness and numbness.  Hematological: Does not bruise/bleed  easily.  Psychiatric/Behavioral: The patient is not nervous/anxious.     Physical Exam Updated Vital Signs BP (!) 184/82 (BP Location: Left Arm)   Pulse 74   Temp 98.2 F (36.8 C) (Oral)   Resp 16   SpO2 100%   Physical Exam Vitals and nursing note reviewed.  Constitutional:      General: He is not in acute distress.    Appearance: He is well-developed.  HENT:     Head: Normocephalic.  Eyes:     General: No scleral icterus.    Conjunctiva/sclera: Conjunctivae normal.  Cardiovascular:     Rate and Rhythm: Normal rate.  Pulmonary:     Effort: Pulmonary effort is normal.  Musculoskeletal:        General: Normal range of motion.     Cervical back: Normal range of motion.     Comments: Full range of motion of all fingers of the left hand.  Specifically full range of motion of the DIP, PIP and MCP of the left pointer finger  Skin:    General: Skin is warm and dry.     Comments: 3 cm laceration proximal to the MCP of the left pointer finger.  Neurological:     Mental Status: He is alert.     Comments: Station intact throughout the entire left hand.  Strength 5/5 in the left hand including grip strength and isolated flexion and extension of the DIP, PIP and MCP of the left pointer  finger.     ED Results / Procedures / Treatments   Labs (all labs ordered are listed, but only abnormal results are displayed) Labs Reviewed - No data to display  EKG None  Radiology No results found.  Procedures .Marland KitchenLaceration Repair  Date/Time: 06/21/2020 7:01 AM Performed by: Abigail Butts, PA-C Authorized by: Abigail Butts, PA-C   Consent:    Consent obtained:  Verbal   Consent given by:  Patient   Risks discussed:  Infection, need for additional repair, pain, poor cosmetic result and poor wound healing   Alternatives discussed:  No treatment and delayed treatment Universal protocol:    Procedure explained and questions answered to patient or proxy's satisfaction: yes      Relevant documents present and verified: yes     Test results available: yes     Imaging studies available: yes     Required blood products, implants, devices, and special equipment available: yes     Site/side marked: yes     Immediately prior to procedure, a time out was called: yes     Patient identity confirmed:  Verbally with patient Anesthesia:    Anesthesia method:  Local infiltration   Local anesthetic:  Lidocaine 2% WITH epi Laceration details:    Location:  Hand   Hand location:  L hand, dorsum   Length (cm):  3 Pre-procedure details:    Preparation:  Patient was prepped and draped in usual sterile fashion Exploration:    Hemostasis achieved with:  Epinephrine and direct pressure   Wound exploration: wound explored through full range of motion and entire depth of wound visualized     Wound extent: no tendon damage noted   Treatment:    Area cleansed with:  Saline   Amount of cleaning:  Standard   Irrigation solution:  Sterile water   Irrigation volume:  559mL   Irrigation method:  Syringe Skin repair:    Repair method:  Sutures   Suture size:  4-0   Suture material:  Prolene   Suture technique:  Simple interrupted   Number of sutures:  3 Approximation:    Approximation:  Close Repair type:    Repair type:  Intermediate Post-procedure details:    Dressing:  Splint for protection   Procedure completion:  Tolerated well, no immediate complications     Medications Ordered in ED Medications  lidocaine-EPINEPHrine (XYLOCAINE W/EPI) 2 %-1:200000 (PF) injection 10 mL (10 mLs Infiltration Given by Other 06/21/20 0600)  Tdap (BOOSTRIX) injection 0.5 mL (0.5 mLs Intramuscular Given 06/21/20 0600)    ED Course  I have reviewed the triage vital signs and the nursing notes.  Pertinent labs & imaging results that were available during my care of the patient were reviewed by me and considered in my medical decision making (see chart for details).    MDM  Rules/Calculators/A&P                          Pressure irrigation performed. Wound explored and base of wound visualized in a bloodless field without evidence of foreign body.  Base of wound explored through range of motion.  No evidence of tendon laceration.  Laceration occurred < 8 hours prior to repair which was well tolerated. Tdap updated.  Pt has no comorbidities to effect normal wound healing. Pt discharged without antibiotics.  Discussed suture home care with patient and answered questions. Pt to follow-up for wound check and suture removal in 7 days; they are  to return to the ED sooner for signs of infection. Pt is hemodynamically stable with no complaints prior to dc.    Final Clinical Impression(s) / ED Diagnoses Final diagnoses:  Laceration of left hand, foreign body presence unspecified, initial encounter    Rx / DC Orders ED Discharge Orders    None       Iysis Germain, Gwenlyn Perking 06/21/20 9528    Fatima Blank, MD 06/22/20 2122

## 2020-06-21 NOTE — ED Notes (Signed)
Patients wound irrigated.

## 2020-06-21 NOTE — ED Notes (Signed)
Thumb spica placed on patient before discharge.

## 2020-06-22 NOTE — Progress Notes (Signed)
Orthopedic Tech Progress Note Patient Details:  Jeff Wall Feb 26, 1995 [id number]          [place] Pink 06/22/2020, 11:09 AMCharge for splint supplies.

## 2020-06-29 ENCOUNTER — Encounter (HOSPITAL_COMMUNITY): Payer: Self-pay | Admitting: Emergency Medicine

## 2020-06-29 ENCOUNTER — Emergency Department (HOSPITAL_COMMUNITY)
Admission: EM | Admit: 2020-06-29 | Discharge: 2020-06-29 | Disposition: A | Discharge status: Home / Self Care | Payer: Medicaid Other | Attending: Emergency Medicine | Admitting: Emergency Medicine

## 2020-06-29 ENCOUNTER — Other Ambulatory Visit: Payer: Self-pay

## 2020-06-29 DIAGNOSIS — S61412D Laceration without foreign body of left hand, subsequent encounter: Secondary | ICD-10-CM | POA: Diagnosis not present

## 2020-06-29 DIAGNOSIS — J45909 Unspecified asthma, uncomplicated: Secondary | ICD-10-CM | POA: Diagnosis not present

## 2020-06-29 DIAGNOSIS — X58XXXD Exposure to other specified factors, subsequent encounter: Secondary | ICD-10-CM | POA: Insufficient documentation

## 2020-06-29 DIAGNOSIS — S6992XD Unspecified injury of left wrist, hand and finger(s), subsequent encounter: Secondary | ICD-10-CM | POA: Diagnosis present

## 2020-06-29 DIAGNOSIS — Z4802 Encounter for removal of sutures: Secondary | ICD-10-CM

## 2020-06-29 NOTE — ED Triage Notes (Signed)
Pt arrived via POV. Pt had a laceration on his left hand and just needs his stiches removed. No other complaints at this time.

## 2020-06-29 NOTE — ED Provider Notes (Signed)
[facility] DEPT Provider Note   CSN: [id number] Arrival date & time: 06/29/20  0932     History Chief Complaint  Patient presents with  . Suture / Staple Removal    Jeff Wall is a 25 y.o. male presents to the ED for suture removal.  He had a laceration in his left hand repaired 8 days ago.  Denies any significant issues with the wound, sutures.  No redness warmth bleeding drainage or separation.  He received his tetanus shot during laceration repair.  HPI     Past Medical History:  Diagnosis Date  . ADHD (attention deficit hyperactivity disorder)   . Asthma     Patient Active Problem List   Diagnosis Date Noted  . Adenotonsillar hypertrophy 12/19/2019    Past Surgical History:  Procedure Laterality Date  . ANKLE FRACTURE SURGERY    . HERNIA REPAIR    . TONSILLECTOMY AND ADENOIDECTOMY Bilateral 12/19/2019   Procedure: TONSILLECTOMY AND ADENOIDECTOMY;  Surgeon: Jason Coop, DO;  Location: [facility];  Service: ENT;  Laterality: Bilateral;       Family History  Problem Relation Age of Onset  . Asthma Sister     Social History   Tobacco Use  . Smoking status: Never Smoker  . Smokeless tobacco: Never Used  Vaping Use  . Vaping Use: Some days  Substance Use Topics  . Alcohol use: No  . Drug use: No    Home Medications Prior to Admission medications   Not on File    Allergies    Sulfa antibiotics  Review of Systems   Review of Systems  Skin: Positive for wound.  All other systems reviewed and are negative.   Physical Exam Updated Vital Signs BP (!) 153/99 (BP Location: Left Arm)   Pulse 73   Temp 98.3 F (36.8 C) (Oral)   Resp 16   Ht 5\' 11"  (1.803 m)   Wt 126.1 kg   SpO2 100%   BMI 38.77 kg/m   Physical Exam Constitutional:      Appearance: He is well-developed.  HENT:     Head: Normocephalic.     Nose: Nose normal.  Eyes:     General: Lids are normal.  Cardiovascular:      Rate and Rhythm: Normal rate.  Pulmonary:     Effort: Pulmonary effort is normal. No respiratory distress.  Musculoskeletal:        General: Normal range of motion.     Cervical back: Normal range of motion.  Skin:    Comments: approx 2 cm linear laceration in left 2nd MCP, wounds are well approximated. 3 sutures in place. No erythema warmth drainage bleeding or dehiscence   Neurological:     Mental Status: He is alert.  Psychiatric:        Behavior: Behavior normal.     ED Results / Procedures / Treatments   Labs (all labs ordered are listed, but only abnormal results are displayed) Labs Reviewed - No data to display  EKG None  Radiology No results found.  Procedures .Suture Removal  Date/Time: 06/29/2020 9:31 AM Performed by: Kinnie Feil, PA-C Authorized by: Kinnie Feil, PA-C   Consent:    Consent obtained:  Verbal   Consent given by:  Patient   Risks, benefits, and alternatives were discussed: yes     Risks discussed:  Bleeding, wound separation and pain Universal protocol:    Procedure explained and questions answered to patient or  proxy's satisfaction: yes     Immediately prior to procedure, a time out was called: yes     Patient identity confirmed:  Verbally with patient Location:    Location:  Upper extremity   Upper extremity location:  Hand   Hand location:  L index finger Procedure details:    Wound appearance:  No signs of infection, good wound healing and clean   Number of sutures removed:  3 Post-procedure details:    Post-removal:  No dressing applied   Procedure completion:  Tolerated well, no immediate complications     Medications Ordered in ED Medications - No data to display  ED Course  I have reviewed the triage vital signs and the nursing notes.  Pertinent labs & imaging results that were available during my care of the patient were reviewed by me and considered in my medical decision making (see chart for details).     MDM Rules/Calculators/A&P                          25 year old here for suture removal.  Sustained a laceration on 5/15.  At that time he had tetanus updated.  Denies any red flags like fever, drainage, bleeding, warmth, significant or worsening pain.  Exam is reassuring, laceration appears to be healing well.  No signs of infection, dehiscence.  Sutures removed here without any complication.  Wound care instructions given.  Return cautions discussed.  Patient is comfortable with this plan.  Final Clinical Impression(s) / ED Diagnoses Final diagnoses:  Visit for suture removal    Rx / DC Orders ED Discharge Orders    None       Kinnie Feil, PA-C 06/29/20 0932    Varney Biles, MD 06/30/20 1214

## 2020-06-29 NOTE — Discharge Instructions (Signed)
Return for signs of infection including redness, warmth, fevers, chills or opening of wound  Apply over the counter antibiotic as needed

## 2020-07-17 ENCOUNTER — Other Ambulatory Visit: Payer: Self-pay | Admitting: Urology

## 2020-07-17 DIAGNOSIS — D2930 Benign neoplasm of unspecified epididymis: Secondary | ICD-10-CM

## 2020-08-13 ENCOUNTER — Ambulatory Visit (HOSPITAL_COMMUNITY)
Admission: RE | Admit: 2020-08-13 | Discharge: 2020-08-13 | Disposition: A | Discharge status: Home / Self Care | Payer: Medicaid Other | Source: Ambulatory Visit | Attending: Urology | Admitting: Urology

## 2020-08-13 ENCOUNTER — Other Ambulatory Visit: Payer: Self-pay

## 2020-08-13 DIAGNOSIS — D2930 Benign neoplasm of unspecified epididymis: Secondary | ICD-10-CM | POA: Diagnosis not present

## 2021-03-14 ENCOUNTER — Other Ambulatory Visit: Payer: Self-pay

## 2021-03-14 ENCOUNTER — Encounter: Payer: Self-pay | Admitting: Emergency Medicine

## 2021-03-14 ENCOUNTER — Ambulatory Visit
Admission: EM | Admit: 2021-03-14 | Discharge: 2021-03-14 | Disposition: A | Discharge status: Home / Self Care | Payer: 59 | Attending: Physician Assistant | Admitting: Physician Assistant

## 2021-03-14 DIAGNOSIS — J0140 Acute pansinusitis, unspecified: Secondary | ICD-10-CM

## 2021-03-14 MED ORDER — AMOXICILLIN-POT CLAVULANATE 875-125 MG PO TABS: 1.0000 | ORAL_TABLET | Freq: Two times a day (BID) | ORAL | 0 refills | Status: AC

## 2021-03-14 NOTE — ED Triage Notes (Signed)
Pt here for nasal congestion and some cough x 2 weeks; denies fever

## 2021-03-14 NOTE — ED Provider Notes (Signed)
[facility]    CSN: [id number] Arrival date & time: 03/14/21  2094      History   Chief Complaint Chief Complaint  Patient presents with   Nasal Congestion         HPI Jeff Wall is a 26 y.o. male.   Patient here today for evaluation of nasal congestion and sinus pressure he has had the last 2 weeks. He initially thought symptoms were related to allergies and tried claritin without significant relief. He then tried other OTC sinus medication without resolution. He has not had fever. He reports some cough from throat irritation at times.   The history is provided by the patient.   Past Medical History:  Diagnosis Date   ADHD (attention deficit hyperactivity disorder)    Asthma     Patient Active Problem List   Diagnosis Date Noted   Adenotonsillar hypertrophy 12/19/2019    Past Surgical History:  Procedure Laterality Date   ANKLE FRACTURE SURGERY     HERNIA REPAIR     TONSILLECTOMY AND ADENOIDECTOMY Bilateral 12/19/2019   Procedure: TONSILLECTOMY AND ADENOIDECTOMY;  Surgeon: Jason Coop, DO;  Location: [facility];  Service: ENT;  Laterality: Bilateral;       Home Medications    Prior to Admission medications   Medication Sig Start Date End Date Taking? Authorizing Provider  amoxicillin-clavulanate (AUGMENTIN) 875-125 MG tablet Take 1 tablet by mouth every 12 (twelve) hours. 03/14/21  Yes Francene Finders, PA-C    Family History Family History  Problem Relation Age of Onset   Asthma Sister     Social History Social History   Tobacco Use   Smoking status: Never   Smokeless tobacco: Never  Vaping Use   Vaping Use: Some days  Substance Use Topics   Alcohol use: No   Drug use: No     Allergies   Sulfa antibiotics   Review of Systems Review of Systems  Constitutional:  Negative for chills and fever.  HENT:  Positive for congestion and sinus pressure. Negative for ear pain and sore throat.   Eyes:  Negative  for discharge and redness.  Respiratory:  Positive for cough. Negative for shortness of breath.   Gastrointestinal:  Negative for abdominal pain, nausea and vomiting.    Physical Exam Triage Vital Signs ED Triage Vitals [03/14/21 1114]  Enc Vitals Group     BP 135/70     Pulse Rate 72     Resp 18     Temp 98.4 F (36.9 C)     Temp Source Oral     SpO2 97 %     Weight      Height      Head Circumference      Peak Flow      Pain Score 0     Pain Loc      Pain Edu?      Excl. in [place]?    No data found.  Updated Vital Signs BP 135/70 (BP Location: Left Arm)    Pulse 72    Temp 98.4 F (36.9 C) (Oral)    Resp 18    SpO2 97%      Physical Exam Vitals and nursing note reviewed.  Constitutional:      General: He is not in acute distress.    Appearance: Normal appearance. He is not ill-appearing.  HENT:     Head: Normocephalic and atraumatic.     Right Ear: There is impacted  cerumen.     Left Ear: There is impacted cerumen.     Nose: Congestion present.     Mouth/Throat:     Mouth: Mucous membranes are moist.     Pharynx: Oropharynx is clear. No oropharyngeal exudate or posterior oropharyngeal erythema.  Eyes:     Conjunctiva/sclera: Conjunctivae normal.  Cardiovascular:     Rate and Rhythm: Normal rate and regular rhythm.     Heart sounds: Normal heart sounds. No murmur heard. Pulmonary:     Effort: Pulmonary effort is normal. No respiratory distress.     Breath sounds: Normal breath sounds. No wheezing, rhonchi or rales.  Skin:    General: Skin is warm and dry.  Neurological:     Mental Status: He is alert.  Psychiatric:        Mood and Affect: Mood normal.        Thought Content: Thought content normal.     UC Treatments / Results  Labs (all labs ordered are listed, but only abnormal results are displayed) Labs Reviewed - No data to display  EKG   Radiology No results found.  Procedures Procedures (including critical care time)  Medications Ordered  in UC Medications - No data to display  Initial Impression / Assessment and Plan / UC Course  I have reviewed the triage vital signs and the nursing notes.  Pertinent labs & imaging results that were available during my care of the patient were reviewed by me and considered in my medical decision making (see chart for details).    Suspect sinusitis given duration of symptoms. Will treat with antibiotic and recommended follow up with any further concerns.   Final Clinical Impressions(s) / UC Diagnoses   Final diagnoses:  Acute pansinusitis, recurrence not specified   Discharge Instructions   None    ED Prescriptions     Medication Sig Dispense Auth. Provider   amoxicillin-clavulanate (AUGMENTIN) 875-125 MG tablet Take 1 tablet by mouth every 12 (twelve) hours. 14 tablet Francene Finders, PA-C      PDMP not reviewed this encounter.   Francene Finders, PA-C 03/14/21 1215

## 2022-01-27 ENCOUNTER — Emergency Department (HOSPITAL_COMMUNITY)
Admission: EM | Admit: 2022-01-27 | Discharge: 2022-01-27 | Discharge status: Against Medical Advice | Payer: Medicaid Other | Attending: Emergency Medicine | Admitting: Emergency Medicine

## 2022-01-27 DIAGNOSIS — I10 Essential (primary) hypertension: Secondary | ICD-10-CM | POA: Insufficient documentation

## 2022-01-27 DIAGNOSIS — Z5321 Procedure and treatment not carried out due to patient leaving prior to being seen by health care provider: Secondary | ICD-10-CM | POA: Insufficient documentation

## 2022-01-27 DIAGNOSIS — R079 Chest pain, unspecified: Secondary | ICD-10-CM | POA: Diagnosis not present

## 2022-01-27 NOTE — ED Provider Triage Note (Signed)
Emergency Medicine Provider Triage Evaluation Note  Jeff Wall , a 26 y.o. male  was evaluated in triage.  Pt complains of chest pain to the substernal area with radiation down his right shoulder that is been ongoing for the past year.  Diagnosed with high blood pressure at a young age but has not been taking medication since he was 89.  States that the pain is exacerbated with any type of activity but improves at rest.  Has not taken anything for improvement in symptoms.  No prior history of CAD, no family history of CAD.  No fevers.  Review of Systems  Positive: Chest pain Negative: Sob, fever, leg swelling  Physical Exam  BP (!) 148/104 (BP Location: Left Arm)   Pulse 81   Temp 98.4 F (36.9 C) (Oral)   Resp 18   SpO2 100%  Gen:   Awake, no distress   Resp:  Normal effort  MSK:   Moves extremities without difficulty  Other:  Lungs are clear to auscultation.  Medical Decision Making  Medically screening exam initiated at 5:10 PM.  Appropriate orders placed.  Jeff Wall was informed that the remainder of the evaluation will be completed by another provider, this initial triage assessment does not replace that evaluation, and the importance of remaining in the ED until their evaluation is complete.     Janeece Fitting, PA-C 01/27/22 1714

## 2022-01-27 NOTE — ED Triage Notes (Signed)
Patient here from home reporting chest pain radiating down right arm. Hx of HTN with no meds.

## 2022-12-09 IMAGING — US US SCROTUM W/ DOPPLER COMPLETE
1 series · 14 of 25 positions shown · non-contrast
Comparison: 07/16/2020

CLINICAL DATA: Lump left testis

EXAM:
SCROTAL ULTRASOUND
DOPPLER ULTRASOUND OF THE TESTICLES
TECHNIQUE: Complete ultrasound examination of the testicles, epididymis, and
other scrotal structures was performed. Color and spectral Doppler
ultrasound were also utilized to evaluate blood flow to the
testicles.

[Series 1: us scrotum w/ doppler complete · 14 of 43 slices shown]
[im 1/43]
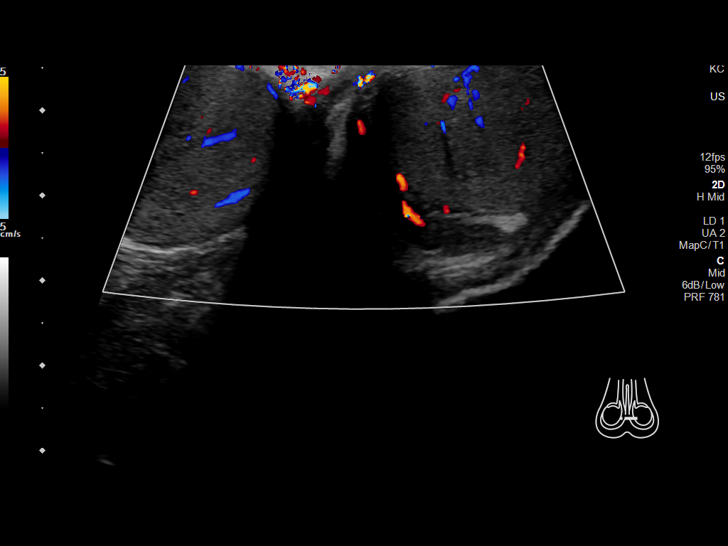
[im 4/43]
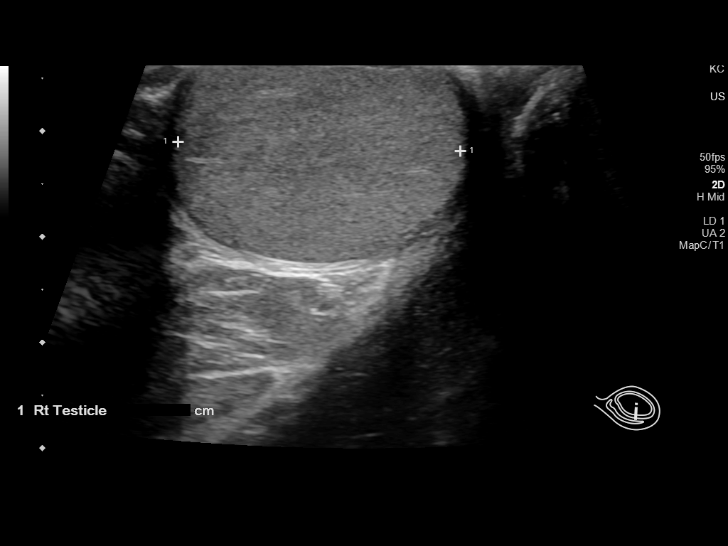
[im 8/43]
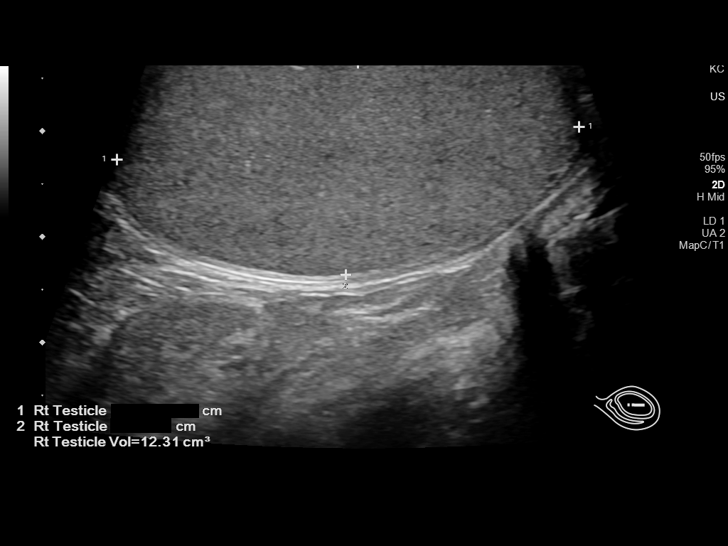
[im 11/43]
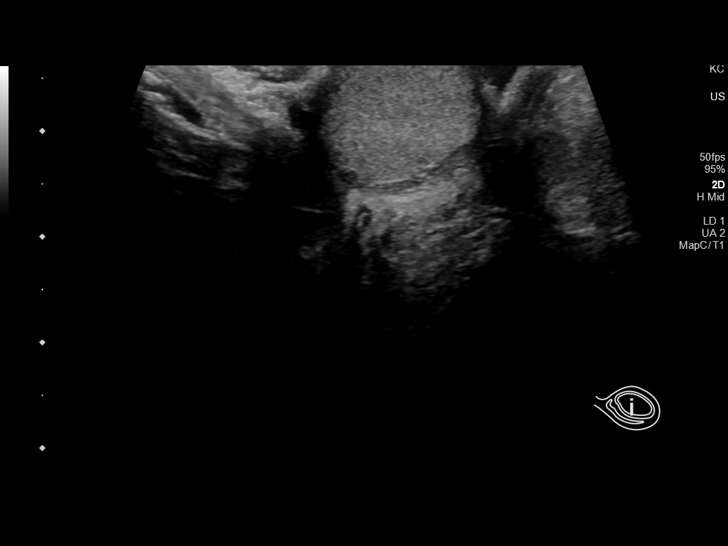
[im 15/43]
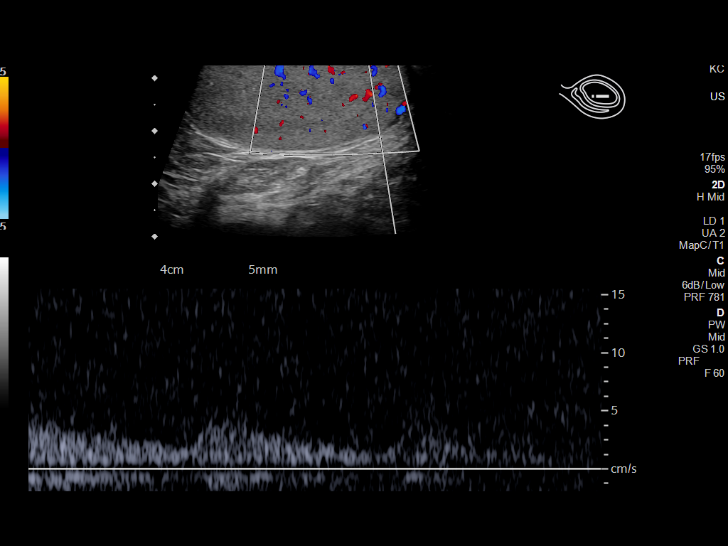
[im 16/43]
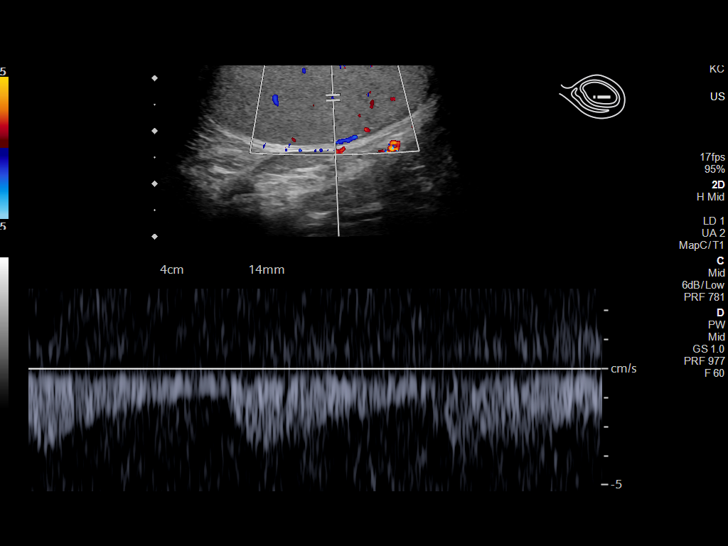
[im 20/43]
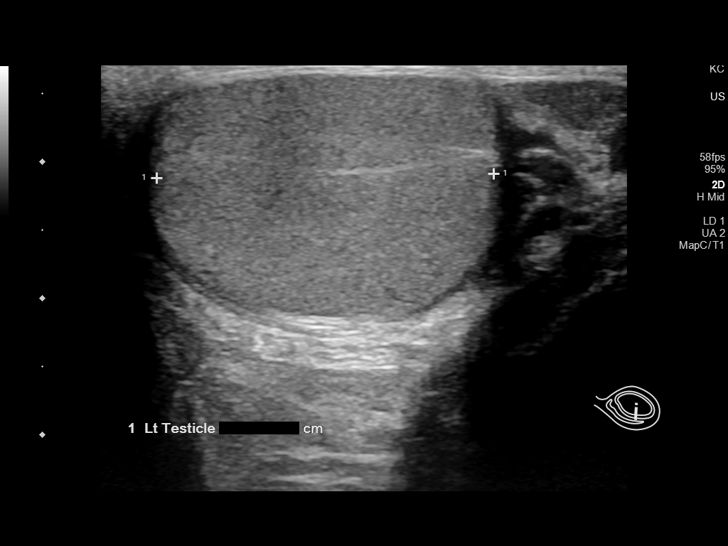
[im 23/43]
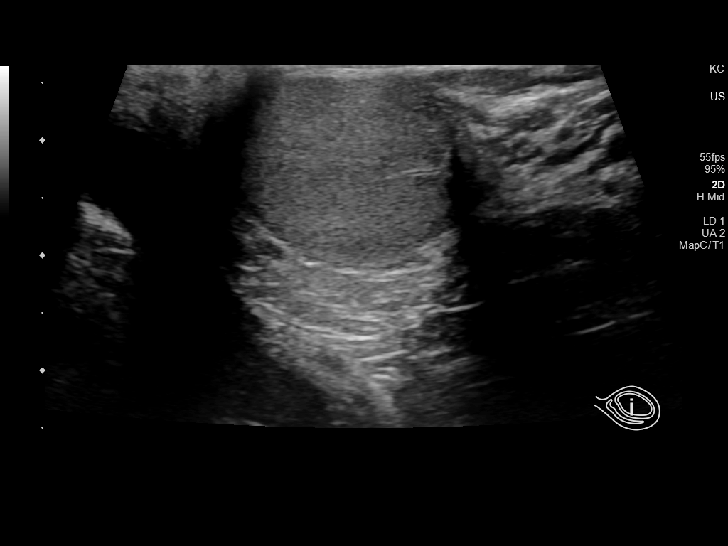
[im 27/43]
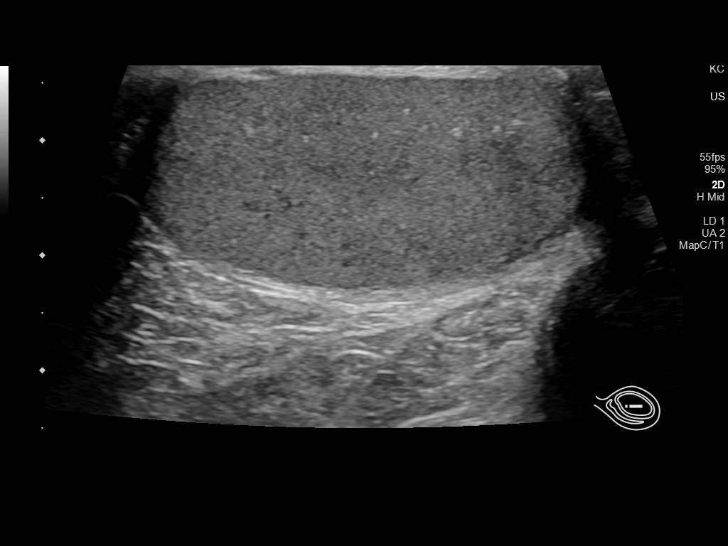
[im 29/43]
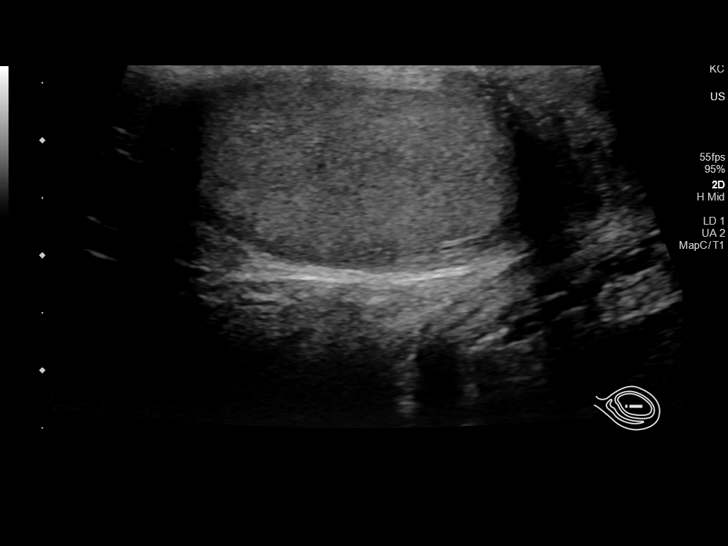
[im 32/43]
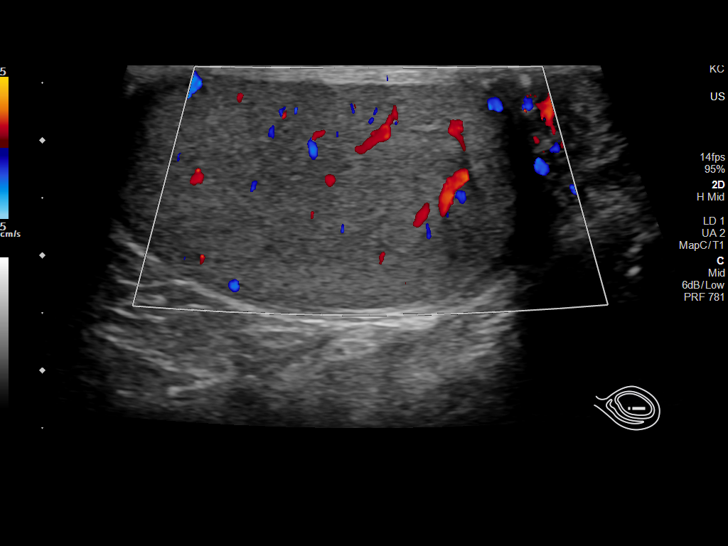
[im 36/43]
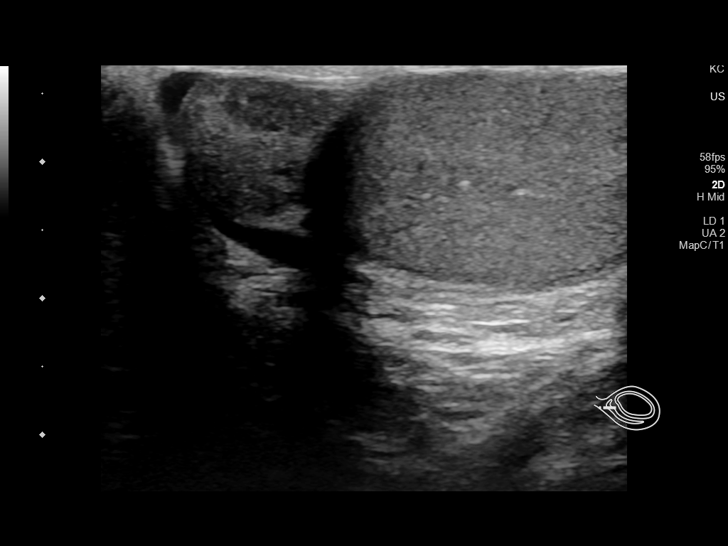
[im 39/43]
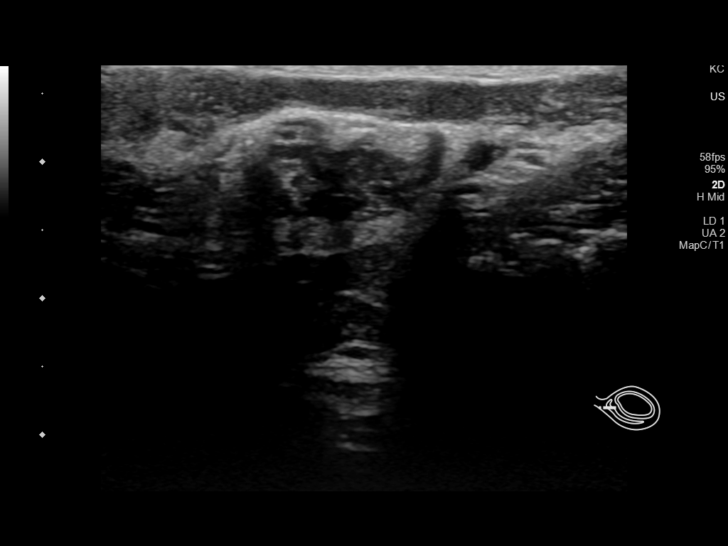
[im 43/43]
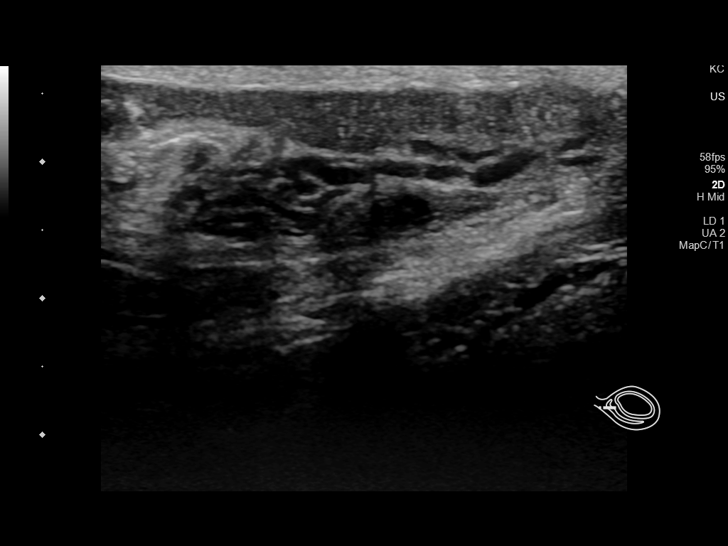

[14 of 25 positions shown; findings below may reference images not displayed]

FINDINGS: Right testicle

Measurements: 4.4 x 2 x 2.7 cm. No mass or microlithiasis
visualized.

Left testicle

Measurements: 3.9 x 1.8 x 2.5 cm. No mass or microlithiasis
visualized.

Right epididymis:  Normal in size and appearance.

Left epididymis:  Normal in size and appearance.

Hydrocele:  None visualized.

Varicocele:  None visualized.

Pulsed Doppler interrogation of both testes demonstrates normal low
resistance arterial and venous waveforms bilaterally.
IMPRESSION: Negative scrotal ultrasound
